# Patient Record
Sex: Male | Born: 1960 | Race: White | Hispanic: No | Marital: Married
Health system: Midwestern US, Community
[De-identification: ages and names within clinical notes are randomized; demographics above are authoritative.]

## PROBLEM LIST (undated history)

## (undated) DIAGNOSIS — R7303 Prediabetes: Secondary | ICD-10-CM

## (undated) DIAGNOSIS — E785 Hyperlipidemia, unspecified: Secondary | ICD-10-CM

## (undated) DIAGNOSIS — G4733 Obstructive sleep apnea (adult) (pediatric): Secondary | ICD-10-CM

## (undated) DIAGNOSIS — E291 Testicular hypofunction: Secondary | ICD-10-CM

## (undated) DIAGNOSIS — E559 Vitamin D deficiency, unspecified: Secondary | ICD-10-CM

## (undated) DIAGNOSIS — I10 Essential (primary) hypertension: Secondary | ICD-10-CM

## (undated) DIAGNOSIS — I493 Ventricular premature depolarization: Secondary | ICD-10-CM

## (undated) DIAGNOSIS — R0781 Pleurodynia: Secondary | ICD-10-CM

## (undated) HISTORY — DX: Vitamin D deficiency, unspecified: E55.9

## (undated) HISTORY — DX: Essential (primary) hypertension: I10

## (undated) HISTORY — DX: Prediabetes: R73.03

## (undated) HISTORY — DX: Testicular hypofunction: E29.1

## (undated) HISTORY — DX: Hyperlipidemia, unspecified: E78.5

## (undated) HISTORY — DX: Obstructive sleep apnea (adult) (pediatric): G47.33

---

## 1996-08-08 HISTORY — PX: ORIF PATELLA: SHX5033

## 1997-11-21 ENCOUNTER — Emergency Department (HOSPITAL_COMMUNITY): Admission: EM | Admit: 1997-11-21 | Discharge: 1997-11-21 | Payer: Self-pay | Admitting: Emergency Medicine

## 1999-10-12 ENCOUNTER — Encounter: Payer: Self-pay | Admitting: Emergency Medicine

## 1999-10-12 ENCOUNTER — Emergency Department (HOSPITAL_COMMUNITY): Admission: EM | Admit: 1999-10-12 | Discharge: 1999-10-12 | Payer: Self-pay | Admitting: Emergency Medicine

## 2000-09-24 ENCOUNTER — Emergency Department (HOSPITAL_COMMUNITY): Admission: EM | Admit: 2000-09-24 | Discharge: 2000-09-24 | Payer: Self-pay | Admitting: Emergency Medicine

## 2000-09-24 ENCOUNTER — Encounter (INDEPENDENT_AMBULATORY_CARE_PROVIDER_SITE_OTHER): Payer: Self-pay | Admitting: *Deleted

## 2000-09-24 ENCOUNTER — Encounter: Payer: Self-pay | Admitting: Otolaryngology

## 2000-11-14 ENCOUNTER — Emergency Department (HOSPITAL_COMMUNITY): Admission: EM | Admit: 2000-11-14 | Discharge: 2000-11-14 | Payer: Self-pay

## 2002-06-15 ENCOUNTER — Emergency Department (HOSPITAL_COMMUNITY): Admission: EM | Admit: 2002-06-15 | Discharge: 2002-06-15 | Payer: Self-pay | Admitting: Emergency Medicine

## 2002-06-15 ENCOUNTER — Encounter: Payer: Self-pay | Admitting: Emergency Medicine

## 2002-12-22 ENCOUNTER — Emergency Department (HOSPITAL_COMMUNITY): Admission: EM | Admit: 2002-12-22 | Discharge: 2002-12-22 | Payer: Self-pay

## 2003-11-08 ENCOUNTER — Emergency Department (HOSPITAL_COMMUNITY): Admission: AD | Admit: 2003-11-08 | Discharge: 2003-11-08 | Payer: Self-pay | Admitting: Internal Medicine

## 2004-03-05 ENCOUNTER — Emergency Department (HOSPITAL_COMMUNITY): Admission: EM | Admit: 2004-03-05 | Discharge: 2004-03-05 | Payer: Self-pay | Admitting: Family Medicine

## 2004-03-13 ENCOUNTER — Emergency Department (HOSPITAL_COMMUNITY): Admission: EM | Admit: 2004-03-13 | Discharge: 2004-03-13 | Payer: Self-pay

## 2004-03-14 ENCOUNTER — Emergency Department (HOSPITAL_COMMUNITY): Admission: EM | Admit: 2004-03-14 | Discharge: 2004-03-14 | Payer: Self-pay | Admitting: Emergency Medicine

## 2004-11-27 ENCOUNTER — Emergency Department (HOSPITAL_COMMUNITY): Admission: EM | Admit: 2004-11-27 | Discharge: 2004-11-27 | Payer: Self-pay | Admitting: Emergency Medicine

## 2005-01-04 ENCOUNTER — Encounter: Admission: RE | Admit: 2005-01-04 | Discharge: 2005-01-04 | Payer: Self-pay | Admitting: Neurology

## 2005-05-07 ENCOUNTER — Emergency Department (HOSPITAL_COMMUNITY): Admission: AD | Admit: 2005-05-07 | Discharge: 2005-05-07 | Payer: Self-pay | Admitting: Family Medicine

## 2005-12-23 ENCOUNTER — Inpatient Hospital Stay (HOSPITAL_COMMUNITY): Admission: EM | Admit: 2005-12-23 | Discharge: 2005-12-27 | Payer: Self-pay | Admitting: *Deleted

## 2005-12-25 ENCOUNTER — Encounter (INDEPENDENT_AMBULATORY_CARE_PROVIDER_SITE_OTHER): Payer: Self-pay | Admitting: Specialist

## 2005-12-27 ENCOUNTER — Ambulatory Visit: Payer: Self-pay | Admitting: Internal Medicine

## 2006-04-29 ENCOUNTER — Emergency Department (HOSPITAL_COMMUNITY): Admission: EM | Admit: 2006-04-29 | Discharge: 2006-04-29 | Payer: Self-pay | Admitting: Family Medicine

## 2006-06-09 ENCOUNTER — Ambulatory Visit (HOSPITAL_COMMUNITY): Admission: RE | Admit: 2006-06-09 | Discharge: 2006-06-09 | Payer: Self-pay | Admitting: Internal Medicine

## 2006-09-17 ENCOUNTER — Emergency Department (HOSPITAL_COMMUNITY): Admission: EM | Admit: 2006-09-17 | Discharge: 2006-09-17 | Payer: Self-pay | Admitting: Emergency Medicine

## 2007-04-15 ENCOUNTER — Emergency Department (HOSPITAL_COMMUNITY): Admission: EM | Admit: 2007-04-15 | Discharge: 2007-04-15 | Payer: Self-pay | Admitting: Family Medicine

## 2007-05-09 ENCOUNTER — Ambulatory Visit (HOSPITAL_COMMUNITY): Admission: RE | Admit: 2007-05-09 | Discharge: 2007-05-09 | Payer: Self-pay | Admitting: Internal Medicine

## 2007-11-15 ENCOUNTER — Emergency Department (HOSPITAL_COMMUNITY): Admission: EM | Admit: 2007-11-15 | Discharge: 2007-11-15 | Payer: Self-pay | Admitting: Emergency Medicine

## 2007-12-22 ENCOUNTER — Observation Stay (HOSPITAL_COMMUNITY): Admission: EM | Admit: 2007-12-22 | Discharge: 2007-12-23 | Payer: Self-pay | Admitting: Emergency Medicine

## 2008-11-28 ENCOUNTER — Emergency Department (HOSPITAL_COMMUNITY): Admission: EM | Admit: 2008-11-28 | Discharge: 2008-11-28 | Payer: Self-pay | Admitting: Emergency Medicine

## 2008-12-25 ENCOUNTER — Emergency Department (HOSPITAL_COMMUNITY): Admission: EM | Admit: 2008-12-25 | Discharge: 2008-12-25 | Payer: Self-pay | Admitting: Emergency Medicine

## 2009-07-30 ENCOUNTER — Encounter: Admission: RE | Admit: 2009-07-30 | Discharge: 2009-07-30 | Payer: Self-pay | Admitting: Internal Medicine

## 2010-01-24 ENCOUNTER — Emergency Department (HOSPITAL_COMMUNITY): Admission: EM | Admit: 2010-01-24 | Discharge: 2010-01-24 | Payer: Self-pay | Admitting: Emergency Medicine

## 2010-05-05 ENCOUNTER — Encounter: Admission: RE | Admit: 2010-05-05 | Discharge: 2010-05-05 | Payer: Self-pay | Admitting: Internal Medicine

## 2010-06-30 ENCOUNTER — Encounter (INDEPENDENT_AMBULATORY_CARE_PROVIDER_SITE_OTHER): Payer: Self-pay | Admitting: *Deleted

## 2010-10-25 LAB — POCT I-STAT, CHEM 8
Chloride: 107 mEq/L (ref 96–112)
Creatinine, Ser: 1.2 mg/dL (ref 0.4–1.5)
Glucose, Bld: 99 mg/dL (ref 70–99)
HCT: 45 % (ref 39.0–52.0)
Hemoglobin: 15.3 g/dL (ref 13.0–17.0)
Sodium: 141 mEq/L (ref 135–145)
TCO2: 25 mmol/L (ref 0–100)

## 2010-12-21 NOTE — H&P (Signed)
NAMEORMAN, [name]                ACCOUNT NO.:  [id number]   MEDICAL RECORD NO.:  [id number]          PATIENT TYPE:  INP   LOCATION:  4740                         FACILITY:  [facility]   PHYSICIAN:  Beckey Rutter, MD  DATE OF BIRTH:  [birth year]   DATE OF ADMISSION:  12/22/2007  DATE OF DISCHARGE:                              HISTORY & PHYSICAL   PRIMARY CARE PHYSICIAN:  Lovenia Kim, D.O.   CHIEF COMPLAINT:  Chest pain.   HISTORY OF PRESENT ILLNESS:  This is a 50 year old African American male  with past medical history significant for hypertension, acid reflux,  frequent headaches, and substance abuse, brought in today by ambulance  because of severe headache and chest pain.  The patient stated that the  headache has started since he woke up and associated with chest pain,  left-sided dull in nature.  The chest pain is constant since the start,  relieved after the patient received second dose of nitroglycerin by the  emergency medical personnel.  He denied any severe chest pain like this.  He denied radiation and he could not identify any aggravating factor.  The pain is 10/10 in its height.  The patient denied premature cardiac  disease in the family and he denied cough or diaphoresis.   REVIEW OF SYSTEMS:  He admit to have frequent headache with frequent  visitation to a neurologist and primary physician for this headache.  The headache is mostly after he wake up from sleep and he start talking.  But, the headache remains throughout the day.  He denied dyspnea, fever,  near-syncope, or palpitation.   PAST MEDICAL HISTORY:  Significant for,  1. Acid reflux disease.  2. Frequent headache.  3. Hypertension.  4. Substance abuse.  5. Tobacco abuse.   ALLERGIES:  NOT KNOWN TO HAVE ANY MEDICATION ALLERGIES.   SOCIAL HISTORY:  Current smoker and occasional drinker.  He denied drug  abuse, but admit to using cocaine last night and he stated that is only  night that he took  cocaine in his recent adult life.   FAMILY HISTORY:  No premature cardiac events.   MEDICATION:  1. Protonix 40 mg daily.  2. Flexeril p.r.n. for headache.   PHYSICAL EXAMINATION:  VITAL SIGNS:  Blood pressure initially done by  the EMS is 240/120, current blood pressure is 137/90, pulse 76,  respiratory rate is 16.  HEAD:  Atraumatic and normocephalic.  EYES: PERRL.  MOUTH:  Moist.  No ulcer.  He has crowded oropharynx.  NECK: Supple.  No JVD.  PERICARDIUM:  First and second sounds audible.  No added sounds.  LUNGS:  Bilateral fair air entry.  No adventitious sounds.  ABDOMEN:  Soft, nontender.  Bowel sounds present.  GENITOURINARY:  No CVA tenderness.  EXTREMITIES: No bilateral lower extremity edema.  NEUROLOGIC:  He is alert, oriented, moving all his extremities  spontaneously.   LABS AND X-RAY:  Drug screen is positive for cocaine.  Sodium is 139,  potassium 4.0, chloride 107, bicarb is 26, glucose 109, BUN is 19,  creatinine is 1.15, INR is 0.9, troponin  is less than 0.05, myoglobin is  63.7, CK-MB is 1.1, white blood count is 7.7, hemoglobin is 14.6,  hematocrit is 43.3, and platelet count is 243.  EKG is showing normal  sinus rhythm with ventricular rate 78, normal axis, normal intervals.  Head CT scan was showing no acute intracranial abnormalities.  Chest x-  ray showing low volumes film with chronic interstitial coarsening.  No  acute cardiopulmonary findings.   ASSESSMENT:  A 50 year old [place] American male presented with chest  tightness and hypertensive urgency after cocaine use.   PLAN:  1. The patient will be admitted to rule out acute coronary syndrome.  2. We will rule out myocardial event by serial cardiac enzymes and      serial EKGs.  3. We will consider cardiology stratification as per hospital course.  4. Substance abuse.  The patient was counseled and we will refer to      substance abuse counseling.  We will avoid beta blocker.  5. Hypertensive  urgency, resolved after sublingual nitro.  We will      continue to monitor.  6. Chronic headache.  The patient has crowded oropharynx and thick      neck where his headache starts mainly in the morning.  I will have      the patient on continuous pulse oximetry to monitor his saturation      during sleep.  The patient was advised to follow up with his      primary physician and primary neurologist for further testing.  For      DVT prophylaxis we will start Lovenox.  For GI management with the      symptoms of gastroesophageal reflux disease, we will start the      patient on Protonix intravenously today and we will continue on      p.o. Protonix afterwards.      Beckey Rutter, MD  Electronically Signed     EME/MEDQ  D:  12/22/2007  T:  12/23/2007  Job:  [id number]   cc:   Lovenia Kim, D.O.

## 2010-12-21 NOTE — Discharge Summary (Signed)
[name], [name]                ACCOUNT NO.:  [id number]   MEDICAL RECORD NO.:  [id number]          PATIENT TYPE:  INP   LOCATION:  4740                         FACILITY:  [facility]   PHYSICIAN:  Beckey Rutter, MD  DATE OF BIRTH:  10-17-1960   DATE OF ADMISSION:  12/22/2007  DATE OF DISCHARGE:  12/23/2007                               DISCHARGE SUMMARY   PRIMARY CARE PHYSICIAN:  Lovenia Kim, DO   CHIEF COMPLAINT ON PRESENTATION:  Chest pain.   HOSPITAL COURSE:  This is a 50 year old pleasant African-American male  presented with headache and chest pain that responded to nitroglycerin  sublingual.  The patient was admitted to rule out acute coronary  syndrome and he is being monitored for 24 hours with the telemetry with  no activity.  His cardiac enzymes were negative and no change in EKGs.  The chest tightness and the headache was felt secondary to the cocaine  abuse the night prior to his presentation.  The patient is stable for  discharge today.  He wanted to follow up with his primary physician for  further cardiology testing as needed.  Second problem was chronic  headache.  The patient was taking Flexeril p.r.n. for this purpose.  He  continued to have headache here which responded to Percocet.  Because  the history provided by the patient indicated that the headache started  after he wakes up, the intention was to have continuous pulse oximetry  at night.  Unfortunately, it was not done overnight, although the spot  O2 saturation/pulse oximetry was always more than 96%.  The patient had  a CT scan on the admission which is essentially negative.  Please refer  to the procedure below.   SUBSTANCE ABUSE:  The patient was counseled and some substance abuse  counseling material was provided.  The patient stated he used cocaine  only once in the recent adulthood life and I would tend to brief him.  At this point of time, I do not think he needs referral to rehab or any  other services.   DISCHARGE DIAGNOSES:  1. Chest tightness felt secondary to cocaine abuse .  2. Chronic headache.  3. Cocaine abuse.  4. Hypertension.  5. On and off tobacco abuse.   DISCHARGE MEDICATIONS:  1. Flexeril p.r.n.  2. Norvasc 5 mg p.o. daily.   Note:  The patient had high blood pressure on presentation and he was  kept on 5 mg of Norvasc overnight.  The blood pressure today is very  stable at 127/84 this morning.  The patient was advised to continue on  Norvasc and to follow with the primary physician for further management.   HOSPITAL PROCEDURES:  Chest x-ray done on the day of admission showing  low volume film with chronic interstitial coarsening.  No acute  cardiopulmonary findings.  Head CT was showing no evidence for acute  hemorrhage, hydrocephalus, mass lesion, or abnormal extra-axial fluid  collection.  No definite CT evidence for acute infarct.  Visualized  portions of the paranasal sinuses and mastoid air cells clear.  Lipid  profile  was showing cholesterol of 221, triglyceride of 250, HDL 45, LDL  is 126, TSH is 0.530.   DISCHARGE PLAN:  The patient is discharged to follow up with Dr.  Elisabeth Most as discussed with him.  He is aware and agreeable to discharge  plan.  Norvasc prescription was written.      Beckey Rutter, MD  Electronically Signed     EME/MEDQ  D:  12/23/2007  T:  12/23/2007  Job:  [id number] [id number]

## 2010-12-24 NOTE — Op Note (Signed)
[place]. [facility]  Patient:    [name], [name]                       MRN: [id number] Proc. Date: 09/24/00 Adm. Date:  [id number] Attending:  Lorre Nick                           Operative Report  PREOPERATIVE DIAGNOSIS:  Fish bone foreign body, left hypopharynx.  POSTOPERATIVE DIAGNOSIS:  Fish bone foreign body, left hypopharynx.  OPERATION:  Direct laryngoscopy and cervical esophagoscopy with removal of foreign body (fish bone).  SURGEON:  Carolan Shiver, M.D.  ANESTHESIA:  General endotracheal - Dr. Gelene Mink.  COMPLICATIONS:  None.  DISCHARGE STATUS:  Stable.  JUSTIFICATION FOR PROCEDURE:  The patient is a 50 year old black male who allegedly swallowed a fish bone on September 22, 2000.  He continued to have pain in his hypopharynx and presented to [facility] Room this morning where he was evaluated by Dr. ________.  The fish bone could not be found and I was contacted as I was on call for the emergency room.  Indirect laryngoscopy with a mirror and direct fiberoptic laryngoscopy performed to the patients left chamber did not reveal any foreign bodies in his hypopharynx. The patient pointed to his left cricoid area.  Soft tissue lateral of his neck and chest x-ray were also negative for foreign body.  Therefore, the patient was recommended for direct laryngoscopy and cervical esophagoscopy in the operating room under general endotracheal anesthesia.  Risks and complications of the procedure were explained to him.  Questions were invited and answered and informed consent signed.  JUSTIFICATION FOR OUTPATIENT SETTING:  Is the patients age and general endotracheal anesthesia.  JUSTIFICATION FOR OVERNIGHT STAY:  No applicable.  DESCRIPTION OF PROCEDURE:  After the patient was taken to the operating room, he was placed in the supine position, and an IV was begun.  General IV induction was performed by Dr. Gelene Mink and the patient was  orally intubated without difficulty.  Eye lids were taped shut and he was properly positioned and monitored.  Elbows and ankles were padded with foam rubber.  The head drape was applied.  The rubber tooth guard was placed and the patients oropharynx and proximal hypopharynx were evaluated with direct laryngoscopes.  Starting with a large wide laryngoscope and then eventually decreased to a size 2 anterior commissuroscope.  Examination of his base of tongue, lateral pharyngeal wall, piriform sinuses, and endolarynx were all negative on initial examination. Therefore, a direct rigid cervical esophagoscope was then inserted and again careful examination of the hypopharynx and endolarynx were performed, occluding the post cricoid area and the proximal cervical esophagus.  I was able to locate a 2.5 cm fish bone impinging in the left inferior base of tongue, lateral pharyngeal wall area.  The fish bone was grasped through the cervical esophagoscope with long cup forceps and directed into the esophagoscope.  The scope was then removed and the foreign body was then removed from the cervical esophagoscope.  A second look was performed with the cervical esophagoscope and no second foreign bodies were found.  The patient was then awakened, extubated, and transferred to his hospital bed. He appeared to tolerate both the general endotracheal anesthesia and the procedures well, and left the operating room in stable condition.  Total fluids 400 cc.  Estimated blood loss none.  The patient will be discharged today  as an outpatient.  He will be instructed to return to my office in one week for followup.  He is to follow a soft diet x 3 days, keep his head elevated, and avoid aspirin or aspirin products.  He is to call [phone] for any postoperative problems.  He will be given both verbal and written instructions. DD:  09/24/00 TD:  09/24/00 Job: [id number]

## 2010-12-24 NOTE — Consult Note (Signed)
[place]. [facility]  Patient:    [name], [name]                      MRN: [id number] Proc. Date: 09/24/00 Attending:  Carolan Shiver, M.D.                          Consultation Report  JUSTIFICATIONS FOR CONSULTATION AND INDICATIONS:  Naven Giambalvo. Coia is a 50 year old, black male, who was seen in [facility] Room this morning with a history of allegedly swallowing a fish bone inadvertently on September 22, 2000.  The fish bone had not passed.  He complained of chronic pain since the 15th.  He points to his midline of his neck just to the left cricoid ring area.  He has had no hemoptysis, change in voice, hoarseness, or history of previous foreign body ingestions.  He is a healthy male, no known allergies, takes no medications, has no loose teeth or dentures, and last drank a half a cup of coffee at 9 a.m. on September 24, 2000.  He has not had solid food for the past 24 hours.  PHYSICAL EXAMINATION:  GENERAL:  On physical examination he is a well-developed, well-nourished, black male, in no acute distress.  He pointed to his left cricoid area as the location of the foreign body.  HEENT:  Examination of his oral cavity showed no foreign bodies.  An anesthetized examination with a laryngeal mirrow, was unsuccessful.  His oropharynx was then sprayed with cetacaine x 2 and limited examination because of lack of cooperation was possible.  I could not locate any foreign body in his proximal base of tongue or proximal hypopharynx.  DESCRIPTION OF PROCEDURE:  The patients nasal chambers were sprayed with Neo-Synephrine spray, then his left nasal chamber was anesthetized with a 2% Xylocaine 1:100,000 epinephrine cotton pledget.  A direct fiberoptic laryngoscopy was then performed in the emergency room.  The direct ______ showed no foreign body could be located at the base of his tongue, lateral pharyngeal walls, vallecula, inferior hypopharynx, piriform  sinus, or endolarynx.  True vocal cords were mobile without any lesions or evidence of a foreign body.  IMPRESSION:  Possible fish bone foreign body hypopharynx or cervical esophagus.  RECOMMENDATIONS: 1. Chest x-ray, PA and lateral, soft tissue lateral neck films, ECG,    CBC with differential, BMET. 2. It is recommended that the patient be taken to the operating room for    direct laryngoscopy and a possible cervical esophagoscopy to attempt    to locate and remove a fish bone foreign body. 3. Risks and complications of the procedure were explained to the patient.    Questions were noted and answered in and informed consent to be    signed.  I also discussed with this Mr. Sorbo wife, who presented to the    emergency room after I had done my examination. DD:  09/24/00 TD:  09/24/00 Job: [id number]

## 2010-12-24 NOTE — Consult Note (Signed)
[name], [name]                ACCOUNT NO.:  [id number]   MEDICAL RECORD NO.:  [id number]          PATIENT TYPE:  INP   LOCATION:  4734                         FACILITY:  [facility]   PHYSICIAN:  Iva Boop, M.D. LHCDATE OF BIRTH:  08-18-1960   DATE OF CONSULTATION:  12/23/2005  DATE OF DISCHARGE:                                   CONSULTATION   REFERRING PHYSICIAN:  Mobolaji B. Corky Downs, M.D.   REASON FOR CONSULTATION:  GI bleeding.   ASSESSMENT:  Diarrhea x2 days and subsequent hematochezia.  His hemoglobin  is normal.  I am suspicious of an infectious diarrhea leading to bleeding.  Ischemic colitis is possible as well as he has a fair amount of cramps.   RECOMMENDATIONS:  1.  Follow up on stools studies.  2.  Review CT results not yet out.  3.  He may need a colonoscopy depending on the results of this.  4.  He apparently had some sort of a colon exam in the past, although he is      really unclear stating that he thought it was done for reflux.  We are      looking to see if there are records for that.  We do not have them at      [facility] and we are checking with [facility] gastroenterologist and here is      not an E-chart.   HISTORY OF PRESENT ILLNESS:  A 50 year old, African-American man that had  watery diarrhea started 2 days ago.  After about the fourth episode, he had  red blood and maybe some clots with that.  He has had no melena.  He has had  crampy abdominal pain, moderate to severe.  He has had small to moderate  amounts of blood.  There is no chronic history of this.  He has not had any  vomiting or nausea.  He had not had fever.  There is no foreign travel.  There is no recent obvious antibiotics, family history or contacts, that I  am aware of, that could lead to this problem.   PAST MEDICAL HISTORY:  1.  Hypertension.  2.  Headaches.  3.  Laryngoscopy.  4.  Removal of a fish bone in the pharynx in 2002.  5.  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   MEDICATIONS PRIOR TO ADMISSION:  Verapamil.   CURRENT MEDICATIONS:  Verapamil, Tylenol and Dilaudid.   SOCIAL HISTORY:  He works in [employer].  He is a smoker.  No significant alcohol reported.  No drug use.   FAMILY HISTORY:  No cancer.  No coronary artery disease.   REVIEW OF SYSTEMS:  Eye glasses are used.  All other systems are negative at  this time.   PHYSICAL EXAMINATION:  GENERAL:  A middle-age, black man in no acute  distress, well-developed, well-nourished.  VITAL SIGNS:  Temperature 98.6, blood pressure 155/74, pulse 59,  respirations 18.  HEENT:  Eyes anicteric.  Mouth free of lesions.  NECK:  Supple.  CHEST:  Clear.  HEART:  S1, S2, no murmurs,  rubs or gallops.  ABDOMEN:  Soft.  Left lower quadrant tenderness.  Mild to moderate.  No  organomegaly or mass.  Bowel sounds are present.  RECTAL:  He has external hemorrhoids with some fresh blood.  There is no  mass reported.  EXTREMITIES:  No clubbing, cyanosis or edema.  SKIN:  Warm and dry, no rash.  NEUROLOGIC:  He is alert and oriented and x3.  Grossly nonfocal.  LYMPHS:  No neck or supraclavicular nodes palpated.   LABORATORY DATA AND X-RAY FINDINGS:  Hemoglobin 15, hematocrit 42, white  count 17,000, platelets 251.  LFTs normal.  Coagulations normal.  BMET  normal including BUN 7 and creatinine 1.2.   CT scan report is pending.   I appreciate the opportunity to care for this patient.      Iva Boop, M.D. [facility]  Electronically Signed     CEG/MEDQ  D:  12/23/2005  T:  12/24/2005  Job:  [id number]   cc:   Lovenia Kim, D.O.  Fax: [phone]

## 2010-12-24 NOTE — Discharge Summary (Signed)
[name], [name]                ACCOUNT NO.:  [id number]   MEDICAL RECORD NO.:  [id number]          PATIENT TYPE:  INP   LOCATION:  4734                         FACILITY:  [facility]   PHYSICIAN:  Lonia Blood, M.D.       DATE OF BIRTH:  1961/04/28   DATE OF ADMISSION:  12/23/2005  DATE OF DISCHARGE:  12/27/2005                                 DISCHARGE SUMMARY   PRIMARY CARE PHYSICIAN:  Lovenia Kim, D.O.   DISCHARGE DIAGNOSIS:  1.  Acute colitis, rule out ulcerative colitis.  2.  Lower gastrointestinal bleed secondary to the colitis.  3.  Anemia.  4.  Hypertension.   DISCHARGE MEDICATIONS:  1.  Prilosec OTC 20 mg daily.  2.  Verapamil 120 mg daily.  3.  Asacol 400 mg 3 tablets twice a day   CONDITION ON DISCHARGE:  The patient will discharge in good condition.  At  the time of discharge he was instructed to follow up with Dr. Leone Payor from  [facility] gastroenterology for the final results of the biopsy of his colon,  and to followup also with Dr. Elisabeth Most his primary care physician.   PROCEDURES DURING THIS ADMISSION:  On Dec 24, 2005 [name] had colonoscopy  which showed acute colitis done by Dr. Lina Sar.   CONSULTATION DURING THIS ADMISSION:  On Dec 23, 2005 the patient was seen in  consultation by Dr. Leone Payor from [facility] gastroenterology   For complete admission history and physical refer to dictated H&P done by  Dr. Corky Downs.   HOSPITAL COURSE:  Problem lower GI bleed:  [name] was admitted to [facility] on Dec 23, 2005 with episodes of passing bright red blood per  rectum.  He had immediate evaluation with stool cultures as well as close  monitoring here in the hospital.  On Dec 23, 2005 the patient underwent a  computed tomography scan of abdomen and pelvis that showed normal  examination.  The patient was then taken to the endoscopy lab where he had a  colonoscopy done by Dr. Lina Sar which revealed an acute colitis.  The  patient had biopsies of  his colon obtained, and the results of the pathology  is still pending.  Dr. Juanda Chance felt that there was a good chance that this  patient may have ulcerative colitis, so Mr. Swindell was started empirically on  Asacol as well as Rowasa enemas.  The patient did respond immediately to the  treatments; and by discharge day on Dec 27 1005, he did not have any more  diarrhea.   The patient was discharged to follow up and instructed to follow up with Dr.  Leone Payor as well as Dr. Elisabeth Most for further decision about his dementia and  ulcerative colitis.  Of note, all the patient's stool cultures as well as  the Clostridium difficile toxin assays were negative.  [name] was without  any antibiotics for 48 hours prior to discharge and he remained afebrile and  stable.      Lonia Blood, M.D.  Electronically Signed     SL/MEDQ  D:  12/27/2005  T:  12/28/2005  Job:  [id number]   cc:   Lovenia Kim, D.O.  Fax: [phone]   Lina Sar, M.D. [facility]  [address]  [place]  [place] [address]   Iva Boop, M.D. [facility]  [address] [place], [place] [address]

## 2010-12-24 NOTE — H&P (Signed)
NAME:  [name], [name]                ACCOUNT NO.:  [id number]   MEDICAL RECORD NO.:  [id number]          PATIENT TYPE:  EMS   LOCATION:  MAJO                         FACILITY:  [facility]   PHYSICIAN:  Mobolaji B. Bakare, M.D.DATE OF BIRTH:  1961-04-28   DATE OF ADMISSION:  12/23/2005  DATE OF DISCHARGE:                                HISTORY & PHYSICAL   PRIMARY CARE PHYSICIAN:  Dr. Lovenia Kim.   CHIEF COMPLAINT:  Bleeding per rectum, bright red blood, since 2 days ago.   HISTORY OF THE PRESENTING COMPLAINT:  Mr. [name] is a pleasant 50 year old  African American male who was in his usual state of health until 2 days ago,  about 4 a.m.  He was woken up with abdominal cramp in the lower abdomen.  He  subsequently had 4 episodes of watery stool.  He felt nauseated, but no  vomiting.  There was no accompanying fever.  After the initial 4 sets of  watery stool, he developed frank blood per rectum.  It comes in clots.  The  cramps improve after passing blood per rectum.  Since 2 days ago, he has not  had any stool, but has been passing bright red blood per rectum every day  and he goes to the bathroom about 20-30 times and this bright red blood in  small quantities.  He denies fever.  He has never had this type of problem  before.  There has been no weight loss, no family history of inflammatory  bowel disease.  Of note is his temperature in the emergency room is 97.3.  He has a leukocytosis of 17,000.  He is awaiting CT scan of abdomen and  pelvis.   REVIEW OF SYSTEMS:  He denies weight loss, chills, fever, vomiting.  No  dysuria, urgency or change in frequency of micturition.  No cough, chest  pain or shortness of breath.  He has occasional headaches which he  attributes to sinuses and high blood pressure.  He uses Bayer Aspirin about  2 times a month to get relief from these headaches.  He last used aspirin 2  days ago, on the morning of onset of above symptoms.   PAST MEDICAL  HISTORY:  Hypertension.   MEDICATIONS:  Verapamil; the patient does not recall dosage.   ALLERGIES:  No known drug allergies.   FAMILY HISTORY:  He is married with children.  There is no family history of  cancer, diabetes or heart disease.   SOCIAL HISTORY:  He does not smoke cigarettes; he never smoked.  He drinks  alcohol occasionally.  He works as a Forensic psychologist.   PHYSICAL EXAMINATION:  INITIAL VITALS:  Temperature 97.3, blood pressure  155/103, now 149/107, heart rate 58-118, O2 SATs 100% on room air.  GENERAL:  The patient is comfortable, not in respiratory distress.  HEENT:  Normocephalic, atraumatic.  Head does not appear pale.  Mucous  membranes moist.  NECK:  No elevated JVD.  No thyromegaly.  LUNGS:  Clear clinically to auscultation.  CV:  S1 and S2 regular.  No murmur, no  gallop, no rub.  ABDOMEN:  Obese.  Soft.  No palpable tenderness.  No organomegaly.  Bowel  sounds present.  EXTREMITIES:  No pedal edema or calf tenderness.  Dorsalis pedis pulses  palpable bilaterally, 2+.  CNS:  No focal neurological deficits.  RECTAL:  There are multiple anal tags, no masses felt in the rectum.  Light-  colored blood stain on gloved finger.   INITIAL LABORATORY DATA:  White cell count 17.3, hemoglobin 15.2, hematocrit  42.8 and platelets 251,000, neutrophils 81%.  Sodium 140, potassium 4.0,  chloride 104, CO2 25, glucose 105, BUN 7, creatinine 1.2, bilirubin 0.7,  alkaline phosphatase 81, AST 18, ALT 15, total protein 7.1, albumin 4.0,  calcium 9.6.  PT 12.1, INR 0.9, PTT 27.  Fecal occult blood positive.   ASSESSMENT AND PLAN:  1.  Mr.  [name] is a 50 year old African American male presenting with      abdominal cramps preceding bright red blood per rectum.  He has      leukocytosis, but no fever.  He is hemodynamically stable and hematocrit      normal.  Gastrointestinal bleed most likely lower gastrointestinal bleed      which may be infectious or inflammatory  bowel disease.  CT scan of the      abdomen and pelvis is pending.  We will admit to telemetry floor, check      hemoglobin and hematocrit every 8 hours, intravenous fluids with normal      saline at 75 mL per hour, place on clear liquid diet, Protonix 40 mg      intravenously daily.  We will give Dilaudid 1 mg q.4 h. p.r.n. for pain,      obtain stool for culture, Clostridium difficile and fecal leukocytes.      Gastroenterology will be consulted for colonoscopy.  2.  Hypertension.  Blood pressure is currently elevated.  We will resume      verapamil CD 120 mg daily until family able to bring medication dosage.      Mobolaji B. Corky Downs, M.D.  Electronically Signed     MBB/MEDQ  D:  12/23/2005  T:  12/23/2005  Job:  [id number]   cc:   Lovenia Kim, D.O.  Fax: [phone]

## 2011-01-31 NOTE — Letter (Signed)
Summary: Colonoscopy Letter  [facility]  [address] [place], [place] [address]   Phone: [phone]  Fax: [phone]      June 30, 2010 MRN: [id number]   [facility] [address] [place], [place]  [address]   Dear Mr. Pember,   According to your medical record, it is time for you to schedule a Colonoscopy. The American Cancer Society recommends this procedure as a method to detect early colon cancer. Patients with a family history of colon cancer, or a personal history of colon polyps or inflammatory bowel disease are at increased risk.  This letter has been generated based on the recommendations made at the time of your procedure. If you feel that in your particular situation this may no longer apply, please contact our office.  Please call our office at [phone] to schedule this appointment or to update your records at your earliest convenience.  Thank you for cooperating with [place] to provide you with the very best care possible.   Sincerely,    Iva Boop, M.D.  [facility] [phone]

## 2011-05-04 LAB — COMPREHENSIVE METABOLIC PANEL
ALT: 19
AST: 20
Albumin: 3.8
Alkaline Phosphatase: 76
Alkaline Phosphatase: 84
BUN: 21
CO2: 26
Chloride: 107
Chloride: 108
Creatinine, Ser: 1.15
GFR calc Af Amer: >60
GFR calc non Af Amer: >60
Glucose, Bld: 109 — ABNORMAL HIGH
Potassium: 3.7
Potassium: 4
Sodium: 142
Total Bilirubin: 0.5
Total Protein: 6.1
Total Protein: 6.7

## 2011-05-04 LAB — POCT CARDIAC MARKERS: Troponin i, poc: <0.05

## 2011-05-04 LAB — DIFFERENTIAL
Basophils Absolute: 0.1
Eosinophils Absolute: 0.2
Eosinophils Relative: 2
Monocytes Absolute: 0.6
Neutro Abs: 4.4

## 2011-05-04 LAB — RAPID URINE DRUG SCREEN, HOSP PERFORMED
Amphetamines: NOT DETECTED
Cocaine: POSITIVE — AB
Tetrahydrocannabinol: NOT DETECTED

## 2011-05-04 LAB — CBC
HCT: 43.3
MCHC: 33.8
RBC: 4.66

## 2011-05-04 LAB — CK TOTAL AND CKMB (NOT AT ARMC)
Relative Index: 1
Total CK: 230

## 2011-05-04 LAB — CARDIAC PANEL(CRET KIN+CKTOT+MB+TROPI)
CK, MB: 1.4
Total CK: 155
Total CK: 180
Troponin I: 0.02

## 2011-05-04 LAB — PHOSPHORUS: Phosphorus: 4.7 — ABNORMAL HIGH

## 2011-05-04 LAB — LIPID PANEL
Cholesterol: 221 — ABNORMAL HIGH
HDL: 45
LDL Cholesterol: 126 — ABNORMAL HIGH

## 2011-05-04 LAB — TSH: TSH: 0.53

## 2011-05-04 LAB — PROTIME-INR: INR: 0.9

## 2011-06-04 ENCOUNTER — Emergency Department (HOSPITAL_COMMUNITY)
Admission: EM | Admit: 2011-06-04 | Discharge: 2011-06-04 | Disposition: A | Discharge status: Home / Self Care | Payer: BC Managed Care – PPO | Attending: Emergency Medicine | Admitting: Emergency Medicine

## 2011-06-04 DIAGNOSIS — K219 Gastro-esophageal reflux disease without esophagitis: Secondary | ICD-10-CM | POA: Insufficient documentation

## 2011-06-04 DIAGNOSIS — I10 Essential (primary) hypertension: Secondary | ICD-10-CM | POA: Insufficient documentation

## 2011-06-04 DIAGNOSIS — Z9889 Other specified postprocedural states: Secondary | ICD-10-CM | POA: Insufficient documentation

## 2011-06-04 DIAGNOSIS — J3489 Other specified disorders of nose and nasal sinuses: Secondary | ICD-10-CM | POA: Insufficient documentation

## 2011-06-04 DIAGNOSIS — R51 Headache: Secondary | ICD-10-CM | POA: Insufficient documentation

## 2011-06-04 DIAGNOSIS — Z79899 Other long term (current) drug therapy: Secondary | ICD-10-CM | POA: Insufficient documentation

## 2011-09-08 NOTE — Progress Notes (Addendum)
CARDIOLOGY CONSULT NOTE       Reason for consultation:  tachycardia    Referring physician:  No admitting provider for patient encounter.     Primary care physician: No primary provider on file.  Alper sarihan DO    Dear  [name],  Thanks for the consult.    History of present illness:Carl Castro is a 51 y.o.year old who  has a past medical history of Hyperlipidemia. and presents with had a chief complaint of Palpitations and an additional complaint of Tachycardia.  he counted 161 HR at one time.    Past medical history:    has a past medical history of Hyperlipidemia.  Past surgical history:   has no past surgical history on file.  Social History:     Family history:  family history includes Heart Disease in his father.    Allergies not on file    No prescriptions on file.    Current Outpatient Prescriptions   Medication Sig Dispense Refill   . aspirin 81 MG tablet Take 81 mg by mouth daily.       . rosuvastatin (CRESTOR) 5 MG tablet Take 5 mg by mouth daily.         No current facility-administered medications for this visit.     Review of Systems: Review of Systems:    Constitutional: No Fever or Weight Loss    Eyes: No Decreased Vision   ENT: No Headaches, Hearing Loss or Vertigo   Cardiovascular: rapid HR   Respiratory: No cough or wheezing     Gastrointestinal: No abdominal pain, appetite loss, blood in stools, constipation, diarrhea or heartburn   Genitourinary: No dysuria, trouble voiding, or hematuria   Musculoskeletal:  No gait disturbance, weakness or joint complaints   Integumentary: No rash or pruritis   Neurological: No TIA or stroke symptoms   Psychiatric: No anxiety or depression   Endocrine: No malaise, fatigue or temperature intolerance   Hematologic/Lymphatic: No bleeding problems, blood clots or swollen lymph nodes  Allergic/Immunologic: No nasal congestion or hives      Physical Examination:    Filed Vitals:    09/08/11 1547   BP: 130/80   Pulse: 68        General Appearance:  good    HEENT:  Pupils are equal and rect to ligh.    NECK: No JVP or carotid bruits    Cardiovascular:  Auscultation: Normal S1 and S2,      Respiratory:  Breath sounds are Normal    Extremities:  No Edema clubbing or cyanosis    SKIN: Warm and well perfused, no pallor or cyanosis    Vascular exam:  Aebdominal Aorta: Normal ,Femoral Arteries: 2+ and equal, Pedal Pulses: 2+ and equal     Abdomen:  No masses or tenderness. No organomegaly noted.    Neurological/Psychiatric:  Oriented to time, place, and person and Non-anxious  No focal neurological deficit noted.    Lab Review   No results found for this basename: WBC, HGB, HCT, PLT,  in the last 72 hours   No results found for this basename: NA, K, CL, CO2, PHOS, BUN, CREATININE, CA,  in the last 72 hours  No results found for this basename: AST, ALT, ALB, BILIDIR, BILITOT, ALKPHOS,  in the last 72 hours  No results found for this basename: TROPONINI,  in the last 72 hours  No results found for this basename: BNP     No results found for this basename: INR,  PROTIME  EKG:    Chest Xray:    ECHO:    Assessment:      Patient Active Problem List   Diagnosis Code   . Hyperlipidemia 272.4   . Tachycardia 785.0       Recommendations:   Echo  Holter for 48  Stress       [name], MD, 09/08/2011 4:12 PM

## 2011-09-15 MED ADMIN — thallous chloride (201 THALLIUM) injection 4 milli Curie: INTRAVENOUS | @ 14:00:00 | NDC 17156029916

## 2011-09-15 MED FILL — THALLOUS CHLORIDE TL 201 1 MCI/ML IV SOLN: 1 MCI/ML | INTRAVENOUS | Qty: 6.6

## 2011-09-26 ENCOUNTER — Telehealth

## 2011-09-26 MED ORDER — METOPROLOL TARTRATE 25 MG PO TABS
25 MG | ORAL_TABLET | Freq: Two times a day (BID) | ORAL | Status: DC
Start: 2011-09-26 — End: 2012-01-11

## 2011-09-26 NOTE — Telephone Encounter (Signed)
Message copied by Terence Lux on Mon Sep 26, 2011  2:00 PM  ------       Message from: Deveron Furlong [facility]       Created: Sun Sep 25, 2011  7:42 PM         Add lopressor 25 bid       Pt has freq pvcs       Fu as scheduled  ------

## 2011-09-26 NOTE — Telephone Encounter (Signed)
Pt returned call after this nurse left msg.Informed pt that per Dr.Akhter wants to start pt on Lopressor 25mg  bid d/t frequent PVCs;Educated pt concerning PVCs & new med.Pt voiced understanding.Sent Rx electronically via Epic to [facility] on [address] per pt's request.

## 2011-10-06 NOTE — Progress Notes (Signed)
[name]  MD  [facility]                                  Dipl. CBNC, NBE, CBCCT, RPVI                                     OFFICE  FOLLOWUP NOTE    Subjective:  Caidence is a 51 y.o.year old who  has a past medical history of Hyperlipidemia; Tachycardia; Palpitations; History of complete ECG; Frequent PVCs; H/O cardiovascular stress test; Family history of early CAD; Atypical chest pain; and H/O echocardiogram. and presents with had a chief complaint of Results.  Chief Complaint   Patient presents with   ??? Results     pt here for the results of holter, echo, and NM. Pt states plaps have calmed down alot since he started metoprolol if having any at all they are only lasting seconds. Pt denies CP, SOB, dizziness, pedal edema.     Filed Vitals:    10/06/11 1632   BP: 128/74   Pulse: 60       Current Outpatient Prescriptions   Medication Sig Dispense Refill   ??? metoprolol (LOPRESSOR) 25 MG tablet Take 1 tablet by mouth 2 times daily.  60 tablet  6   ??? aspirin 81 MG tablet Take 81 mg by mouth daily.       ??? rosuvastatin (CRESTOR) 5 MG tablet Take 5 mg by mouth daily.         No current facility-administered medications for this visit.     Allergies: Review of patient's allergies indicates no known allergies.  Past Medical History   Diagnosis Date   ??? Hyperlipidemia    ??? Tachycardia    ??? Palpitations    ??? History of complete ECG 09/08/2011   ??? Frequent PVCs    ??? H/O cardiovascular stress test 09/15/2011     09/15/2011-Normal study. No ischemia. LVSF normal. EF 63%.    ??? Family history of early CAD    ??? Atypical chest pain    ??? H/O echocardiogram 09/15/2011     09/15/2011- LVSF normal. EF 55%.Impaired LV relaxation. Mild TR.-report in epic     No past surgical history on file.  Family History   Problem Relation Age of Onset   ??? Heart Disease Father      CMP A-fib     History   Substance Use Topics   ??? Smoking status: Never Smoker    ??? Smokeless tobacco: Not on file   ??? Alcohol Use: Not on file       Review of systems:  HEENT: Neg  Card:Neg   GI;Neg  GU: Neg  Neuro: Neg  Psych: Neg  Derm: Neg  MS; Neg  All: Documented  Constitutional: Neg    Objective:  BP 128/74   Pulse 60   Ht 6\' 3"  (1.905 m)   Wt 250 lb (113.399 kg)   BMI 31.25 kg/m2  Wt Readings from Last 3 Encounters:   10/06/11 250 lb (113.399 kg)   09/08/11 250 lb (113.399 kg)     Body mass index is 31.25 kg/(m^2).  GENERAL - Alert, oriented, pleasant, in no apparent distress.    HEENT - Unremarkable.  Neck - Supple.  No jugular venous distention noted. No carotid bruits.   Cardiovascular - Normal S1  and S2 without obvious murmur or gallop.    Extremities - No cyanosis, clubbing, or significant edema.    Pulmonary - No respiratory distress.  No wheezes or rales.    Abdomen - No masses, tenderness, or organomegaly.  Musculoskeletal - No significant edema.   Neurologic - Cranial nerves II through XII are grossly intact.  There were no gross focal neurologic abnormalities.    No results found for this basename: CKTOTAL,  CKMB,  CKMBINDEX,  TROPONINI     BNP:    No results found for this basename: BNP     PT/INR:    No components found with this basename: PTPATIENT,  PTINR     No results found for this basename: LABA1C     No results found for this basename: chol,  trig,  hdl,  ldlcalc,  ldldirect     No results found for this basename: ALT,  AST     TSH:    No results found for this basename: TSH       Impression:    @DIAGXICD9 @   Patient Active Problem List   Diagnosis Code   ??? Hyperlipidemia 272.4   ??? Tachycardia 785.0   ??? V tach 427.1     @PROBNGICD9 @  Problem List Items Addressed This Visit    Hyperlipidemia    Tachycardia    V tach - Primary        Plan:  Palpitatios are better  Repeat holter in 4 weeks  FU in 3 mos

## 2011-10-17 NOTE — Telephone Encounter (Signed)
Results of nm and echo called to patient

## 2011-11-10 NOTE — Telephone Encounter (Signed)
Per DR.Mohammed,phoned pt & informed him to be @hospital  tomorrow @ 6am for STAT upon arrival.Pt voiced understanding.Faxed cover sheet over to central scheduling & cath.lab @[facility]  concerning above.ALso faxed over signed consent per DR.Mohammed to cath lab for pt to sign upon arrival to hospital tomorrow.

## 2011-11-10 NOTE — H&P (Signed)
HISTORY & PHYSICAL  Reason for hospitalization: Cath to R/O significant CAD because of multiple NSVT  ( on going ) runs.    History of present illness:  Carl Castro is a 51 y.o.year old who has a past medical history of Hyperlipidemia; Tachycardia; Palpitations; History of complete ECG; Frequent PVCs; H/O cardiovascular stress test; Family history of early CAD; Atypical chest pain; and H/O echocardiogram. and is brought in for cath.  denies CP, SOB, dizziness, pedal edema.    Filed Vitals:     10/06/11 1632    BP:  128/74    Pulse:  60      Current Outpatient Prescriptions    Medication  Sig  Dispense  Refill    .  metoprolol (LOPRESSOR) 25 MG tablet  Take 1 tablet by mouth 2 times daily.  60 tablet  6    .  aspirin 81 MG tablet  Take 81 mg by mouth daily.      .  rosuvastatin (CRESTOR) 5 MG tablet  Take 5 mg by mouth daily.        No current facility-administered medications for this visit.    Allergies: Review of patient's allergies indicates no known allergies.   Past Medical History    Diagnosis  Date    .  Hyperlipidemia     .  Tachycardia     .  Palpitations     .  History of complete ECG  09/08/2011    .  Frequent PVCs     .  H/O cardiovascular stress test  09/15/2011      09/15/2011-Normal study. No ischemia. LVSF normal. EF 63%.    .  Family history of early CAD     .  Atypical chest pain     .  H/O echocardiogram  09/15/2011      09/15/2011- LVSF normal. EF 55%.Impaired LV relaxation. Mild TR.-report in epic    No past surgical history on file.   Family History    Problem  Relation  Age of Onset    .  Heart Disease  Father       CMP A-fib      History    Substance Use Topics    .  Smoking status:  Never Smoker    .  Smokeless tobacco:  Not on file    .  Alcohol Use:  Not on file    Review of systems:   HEENT: Neg   Card:Neg   GI;Neg   GU: Neg   Neuro: Neg   Psych: Neg   Derm: Neg   MS; Neg   All: Documented   Constitutional: Neg   Objective:   BP 128/74  Pulse 60  Ht  6\' 3"  (1.905 m)  Wt 250 lb (113.399 kg)  BMI 31.25 kg/m2   Wt Readings from Last 3 Encounters:    10/06/11  250 lb (113.399 kg)    09/08/11  250 lb (113.399 kg)    Body mass index is 31.25 kg/(m^2).   GENERAL - Alert, oriented, pleasant, in no apparent distress.   HEENT - Unremarkable.   Neck - Supple. No jugular venous distention noted. No carotid bruits.   Cardiovascular - Normal S1 and S2 without obvious murmur or gallop.   Extremities - No cyanosis, clubbing, or significant edema.   Pulmonary - No respiratory distress. No wheezes or rales.   Abdomen - No masses, tenderness, or organomegaly.   Musculoskeletal - No significant edema.   Neurologic - Cranial  nerves II through XII are grossly intact. There were no gross focal neurologic abnormalities.   Labs : pending    Impression:      Patient Active Problem List    Diagnosis  Code    .  Hyperlipidemia  272.4    .  Tachycardia  785.0    .  V tach  427.1      Problem List Items Addressed This Visit     Hyperlipidemia     Tachycardia     V tach - Primary       Plan:  Left hear cath with coronary angiography & left ventriculgraphy.  Informed consent obtained.

## 2011-11-10 NOTE — Telephone Encounter (Signed)
LM on pt's VM @ home # concerning that Dr.Mohammed wants to do LHC poss on pt tomorrow;also called pt's wife on her cell #,informing her of the same.She stated would have pt call this nurse back.

## 2011-11-10 NOTE — Telephone Encounter (Signed)
Pt returned call & informed him about his holter monitor,along w/DR.Mohammed wanting to do LHC poss.d/t Dr.Akhter is out of town.Pt agreed.Pt educated on LHC poss.,scheduled for tomorrow @ 9am,w/arrival @ 7am,@[facility] ;risks explained;& will sign consent upon arrival to hosp.in am.Also labs & CXR will be done STAT upon arrival as well;NPO after midnight;may take morning meds.w/only sips of water tomorrow am;& pt voiced understanding.Also informed him if he or his wife have questions or concerns this evening,to call office back before 5pm.Dr.Mohammed to place orders in Epic system.

## 2011-11-11 LAB — CBC WITH AUTO DIFFERENTIAL
Basophils %: 0.6 % (ref 0–1)
Basophils Absolute: 0 10*3/uL (ref 0.0–0.1)
Eosinophils %: 2 % (ref 0–4)
Eosinophils Absolute: 0.1 10*3/uL (ref 0.0–0.6)
Hematocrit: 46 % (ref 41–51)
Hemoglobin: 15.3 GM/DL (ref 14.0–18.0)
Lymphocytes %: 45.2 % — ABNORMAL HIGH (ref 22–40)
Lymphocytes Absolute: 3 10*3/uL (ref 0.6–4.3)
MCH: 31 PG (ref 28–34)
MCHC: 33.2 % (ref 32.0–36.0)
MCV: 93.3 FL (ref 82–101)
MPV: 9.2 FL (ref 7.5–11.1)
Monocytes %: 7.6 % (ref 5–9)
Monocytes Absolute: 0.5 10*3/uL (ref 0.2–1.1)
Platelets: 221 10*3/uL (ref 140–440)
RBC: 4.93 10*6/uL (ref 4.45–6.0)
RDW: 13.6 % (ref 11.7–14.9)
Segs Absolute: 2.9 10*3/uL (ref 1.5–7.0)
Segs Relative: 44.6 % — ABNORMAL LOW (ref 49–69)
WBC: 6.6 10*3/uL (ref 4.5–11.0)

## 2011-11-11 LAB — BASIC METABOLIC PANEL
BUN: 14 mg/dL (ref 6–23)
CO2: 26 mMol/L (ref 20–32)
Calcium: 9.2 mg/dL (ref 8.3–10.6)
Chloride: 112 mMol/L — ABNORMAL HIGH (ref 99–109)
Creatinine: 1 mg/dL (ref 0.9–1.3)
GFR African American: >60 mL/min/{1.73_m2}
GFR Non-African American: >60 mL/min/{1.73_m2}
Glucose: 114 MG/DL (ref 70–140)
Potassium: 4.2 MMOL/L (ref 3.5–5.1)
Sodium: 144 mmol/L (ref 136–145)

## 2011-11-11 LAB — PROTIME-INR
INR: 0.99 INDEX
Protime: 10.8 SECONDS (ref 10–14.3)

## 2011-11-11 MED ADMIN — 0.9 % sodium chloride infusion: INTRAVENOUS | @ 11:00:00 | NDC 00338004904

## 2011-11-11 MED ADMIN — diphenhydrAMINE (BENADRYL) tablet 50 mg: ORAL | @ 11:00:00 | NDC 00904530661

## 2011-11-11 MED ADMIN — diazepam (VALIUM) tablet 5 mg: ORAL | @ 11:00:00 | NDC 63739007310

## 2011-11-11 MED FILL — DIAZEPAM 5 MG PO TABS: 5 MG | ORAL | Qty: 1

## 2011-11-11 MED FILL — NORMAL SALINE FLUSH 0.9 % IV SOLN: 0.9 % | INTRAVENOUS | Qty: 10

## 2011-11-11 MED FILL — LIDOCAINE HCL 2 % IJ SOLN: 2 % | INTRAMUSCULAR | Qty: 20

## 2011-11-11 MED FILL — MIDAZOLAM HCL 2 MG/2ML IJ SOLN: 2 MG/ML | INTRAMUSCULAR | Qty: 2

## 2011-11-11 MED FILL — HEPARIN (PORCINE) IN NACL 2-0.9 UNIT/ML-% IJ SOLN: INTRAMUSCULAR | Qty: 1500

## 2011-11-11 MED FILL — DIPHENHYDRAMINE HCL 25 MG PO TABS: 25 MG | ORAL | Qty: 2

## 2011-11-11 MED FILL — OPTIRAY 350 74 % IV SOLN: 74 % | INTRAVENOUS | Qty: 50

## 2011-11-11 MED FILL — OPTIRAY 350 74 % IV SOLN: 74 % | INTRAVENOUS | Qty: 100

## 2011-11-11 NOTE — Progress Notes (Signed)
Received into cath lab holding to prepare for procedure. Procedure explained, verbalizes understanding.  Pre-procedure assessment done.

## 2011-11-11 NOTE — Discharge Instructions (Addendum)
Cardiac catheterization  Home Instuctions  Name:Carl Castro Age  DOB:[birth year]  Your instructions:    Shower only for the first 5 days, NO TUB BATHS.      Wash procedure site with mild soap, rinse and pat dry.  Do not rub over the procedure site for two (2) days.  Remove dressing and replace with a band-aid in 2 days.    Site observations-Observe the area for bleeding or hematoma (lump under the skin caused by bleeding.)      A.  Painless bruising is normal.  B.  If a firmness/swelling seems to be increasing or there is bright red bleeding from site       (hold pressure over procedure site) and  CALL 911 IMMEDIATELY.    What to do after you leave the hospital:    Recommended activity: no lifting, Driving, or Strenuous exercise for 3 days.  DO NOT lift more than 10lbs.    Follow-up with physician in 7-10 days.    Take your medication as ordered.      If you have any questions, you may call [phone] or your physicians office    Information obtained by:  By signing below, I understand that if any problems occur once I leave the hospital I am to contact my cardiologist or family physician.  I understand and acknowledge receipt of the instructions indicated above.

## 2011-11-11 NOTE — Progress Notes (Signed)
Received from cath lab/IR.  Hooked to all monitoring equipment, alarms on.  Assessment completed and post procedure vital signs initiated. Offered and given refreshment and food tray ordered.

## 2011-11-11 NOTE — Progress Notes (Signed)
Ambulated to bathroom and back to room.  R groin free of bleeding or hematoma.  Discharge instructions given to patient and his wife.  Verbalizes understanding.

## 2011-11-11 NOTE — Progress Notes (Deleted)
Transferred to 3109 at 1140 per bed on monitor.

## 2011-11-11 NOTE — Progress Notes (Signed)
Discharged to home with wife via main entrance.

## 2011-11-11 NOTE — Op Note (Signed)
[name] (51 y.o., male)  09-13-1960      11/11/2011      PROCEDURE:  Left heart catheterization, selective coronary arteriography,  left ventricular angiography.     PREPROCEDURE DIAGNOSIS:  NSVT      POSTOPERATIVE DIAGNOSIS:  Patent coronary arteries and preserved left  ventricular function.  Right heart cath was not done.     DESCRIPTION OF PROCEDURE:  The patient had the procedure described and  informed consent is obtained.  Prepared, draped and monitored in the routine  fashion.  Two percent lidocaine without epinephrine was used for local  infiltration anesthesia.  Time out procedure performed per protocol.  The  right femoral artery was punctured and cannulated with 4-French introducer  sheath.  A 4-French, 4-cm left Judkins and 4 F AR Mod catheters were used for  selective coronary arteriography.  Multiple views were obtained in various  projections.  Left ventricular angiography was performed using angled pigtail left  ventricular angiography catheter. Image obtained in 30 degree RAO view.  He  tolerated the procedure well.  At the end of the procedure, femoral arterial  sheath was discontinued and hemostasis achieved with manual compression.   Total fluoroscopic time was 1.5 minutes.  Total amount of contrast used was  70 mL.  No immediate complications were noted.  Estimated blood loss was <10 mL.     FINDINGS:  Hemodynamics:  Aortic pressure 100/61, mean of 77, LV pressure  100, EDP of 9.      ANGIOGRAPHIC FINDINGS: Right dominant system.    1.  Left main:  Left main is normal  2.  Left anterior descending is normal        Diagonal branchnormal  3.  Left Circumflex artery is normal        Obtuse marginal branch is normal       Ramus intermedius branch is normal  4.  Right coronary artery is normal        Postetrolateral branch is normal        PDA branch is normal   5.  Left ventricle is normal in size and shape with normal contractility.   Ejection fraction is 55 %.     No immediate  complications.      COMMENTS AND DISPOSITION   Medical management + EP evaluation ? RVOT v-tach.   The patient is counseledfor continued risk modification for  prevention.    Findings are reviewedwith the patient and discussed with the family.    Follow up in office in one week.                                                                      Rejeana Brock, MD, [facility],.

## 2011-11-15 NOTE — Progress Notes (Signed)
Carl Brooks  MD  [facility]                                  Dipl. CBNC, NBE, CBCCT, RPVI                                     OFFICE  FOLLOWUP NOTE    Subjective:  Carl Castro is a 52 y.o.year old who  has a past medical history of Hyperlipidemia; Tachycardia; Palpitations; History of complete ECG; Frequent PVCs; H/O cardiovascular stress test; Family history of early CAD; Atypical chest pain; H/O echocardiogram; and S/P cardiac cath. and presents with had a chief complaint of Follow Up After Procedure.  Chief Complaint   Patient presents with   . Follow Up After Procedure     Pt denies any large knots at groin site, however he states he didnt notice any bruising until last night and states today it's bigger and deeper shade, but it's not painful. Pt states he has noticed that he occ gets alittle dizzy while standing doesnt happen all the time only on occ and dizziness only lasts seconds.     Filed Vitals:    11/15/11 1447   BP: 134/72   Pulse: 64       Current Outpatient Prescriptions   Medication Sig Dispense Refill   . metronidazole (METROGEL) 0.75 % gel Apply  topically daily. Apply topically 2 times daily.       . metoprolol (LOPRESSOR) 25 MG tablet Take 1 tablet by mouth 2 times daily.  60 tablet  6   . aspirin 81 MG tablet Take 81 mg by mouth daily.       . rosuvastatin (CRESTOR) 5 MG tablet Take 5 mg by mouth daily.         No current facility-administered medications for this visit.     Allergies: Review of patient's allergies indicates no known allergies.  Past Medical History   Diagnosis Date   . Hyperlipidemia    . Tachycardia    . Palpitations    . History of complete ECG 09/08/2011   . Frequent PVCs    . H/O cardiovascular stress test 09/15/2011     09/15/2011-Normal study. No ischemia. LVSF normal. EF 63%.    . Family history of early CAD    . Atypical chest pain    . H/O echocardiogram 09/15/2011     09/15/2011- LVSF normal. EF 55%.Impaired LV relaxation. Mild TR.-report in epic    . S/P cardiac cath 11/11/2011     normal coronaries. reffer for EP study     No past surgical history on file.  Family History   Problem Relation Age of Onset   . Heart Disease Father      CMP A-fib     History   Substance Use Topics   . Smoking status: Former Games developer   . Smokeless tobacco: Not on file   . Alcohol Use: Not on file      Review of systems:  HEENT: Neg  Card:Neg   GI;Neg  GU: Neg  Neuro: Neg  Psych: Neg  Derm: Neg  MS; Neg  All: Documented  Constitutional: Neg    Objective:  BP 134/72  Pulse 64  Ht 6\' 3"  (1.905 m)  Wt 244 lb 8 oz (110.904 kg)  BMI 30.56 kg/m2  Wt Readings  from Last 3 Encounters:   11/15/11 244 lb 8 oz (110.904 kg)   11/11/11 245 lb (111.131 kg)   10/06/11 250 lb (113.399 kg)     Body mass index is 30.56 kg/(m^2).  GENERAL - Alert, oriented, pleasant, in no apparent distress.    HEENT - Unremarkable.  Neck - Supple.  No jugular venous distention noted. No carotid bruits.   Cardiovascular - Normal S1 and S2 without obvious murmur or gallop.    Extremities - No cyanosis, clubbing, or significant edema.    Pulmonary - No respiratory distress.  No wheezes or rales.    Abdomen - No masses, tenderness, or organomegaly.  Musculoskeletal - No significant edema.   Neurologic - Cranial nerves II through XII are grossly intact.  There were no gross focal neurologic abnormalities.    No results found for this basename: CKTOTAL,  CKMB,  CKMBINDEX,  TROPONINI     BNP:    No results found for this basename: BNP     PT/INR:    No components found with this basename: PTPATIENT,  PTINR     No results found for this basename: LABA1C     No results found for this basename: chol,  trig,  hdl,  ldlcalc,  ldldirect     No results found for this basename: ALT,  AST     TSH:    No results found for this basename: TSH     Carl Castro (51 y.o., male)   16-Jun-1961   11/11/2011   PROCEDURE: Left heart catheterization, selective coronary arteriography,   left ventricular angiography.   PREPROCEDURE DIAGNOSIS:  NSVT   POSTOPERATIVE DIAGNOSIS: Patent coronary arteries and preserved left   ventricular function. Right heart cath was not done.   DESCRIPTION OF PROCEDURE: The patient had the procedure described and   informed consent is obtained. Prepared, draped and monitored in the routine   fashion. Two percent lidocaine without epinephrine was used for local   infiltration anesthesia. Time out procedure performed per protocol. The   right femoral artery was punctured and cannulated with 4-French introducer   sheath. A 4-French, 4-cm left Judkins and 4 F AR Mod catheters were used for   selective coronary arteriography. Multiple views were obtained in various   projections. Left ventricular angiography was performed using angled pigtail left   ventricular angiography catheter. Image obtained in 30 degree RAO view. He   tolerated the procedure well. At the end of the procedure, femoral arterial   sheath was discontinued and hemostasis achieved with manual compression.   Total fluoroscopic time was 1.5 minutes. Total amount of contrast used was   70 mL. No immediate complications were noted. Estimated blood loss was <10 mL.   FINDINGS: Hemodynamics: Aortic pressure 100/61, mean of 77, LV pressure   100, EDP of 9.   ANGIOGRAPHIC FINDINGS: Right dominant system.   1. Left main: Left main is normal   2. Left anterior descending is normal   Diagonal branchnormal   3. Left Circumflex artery is normal   Obtuse marginal branch is normal   Ramus intermedius branch is normal   4. Right coronary artery is normal   Postetrolateral branch is normal   PDA branch is normal   5. Left ventricle is normal in size and shape with normal contractility.   Ejection fraction is 55 %.   No immediate complications.   COMMENTS AND DISPOSITION   Medical management + EP evaluation ? RVOT v-tach.   The  patient is counseledfor continued risk modification for prevention.   Findings are reviewedwith the patient and discussed with the family.   Follow up in  office in one week.   Rejeana Brock, MD, [facility],.         Impression:    @DIAGXICD9 @   Patient Active Problem List   Diagnosis Code   . Hyperlipidemia 272.4   . Tachycardia 785.0   . V tach 427.1     @PROBNGICD9 @  Problem List Items Addressed This Visit    Hyperlipidemia    Tachycardia    V tach - Primary        Plan:  Refer to Dr Raechel Ache  Rt groin [place]

## 2011-11-16 NOTE — Telephone Encounter (Signed)
Paper work sent to Dr. Raechel Ache 11-15-11 they will contact patient for appointment.

## 2011-11-16 NOTE — Telephone Encounter (Signed)
PATIENT TO CALL TODAY IF DIDN'T HEAR FROM [place] REGAURDING APPT WITH DR HACKETT GAVE PATIENT NUMBER TO CHECK ON APPT ALSO

## 2011-11-28 NOTE — Addendum Note (Signed)
Addended by: Chiquita Loth on: 11/28/2011 02:40 PM     Modules accepted: Orders

## 2011-12-13 NOTE — Telephone Encounter (Signed)
Taken care of

## 2012-01-11 NOTE — Progress Notes (Signed)
Carl Brooks  MD  [facility]                                  Dipl. CBNC, NBE, CBCCT, RPVI                                     OFFICE  FOLLOWUP NOTE    Subjective:  Carl Castro is a 51 y.o.year old who  has a past medical history of Hyperlipidemia; Tachycardia; Palpitations; History of complete ECG; Frequent PVCs; H/O cardiovascular stress test; Family history of early CAD; Atypical chest pain; H/O echocardiogram; S/P cardiac cath; H/O 24 hour EKG monitoring; and S/P radiofrequency ablation operation for arrhythmia. and presents with had a chief complaint of Palpitations.  Chief Complaint   Patient presents with   ??? Palpitations     OV 3 month check, no cardiac sx     Filed Vitals:    01/11/12 1639   BP: 134/80   Pulse: 80       Current Outpatient Prescriptions   Medication Sig Dispense Refill   ??? metronidazole (METROGEL) 0.75 % gel Apply  topically daily. Apply topically 2 times daily.       ??? aspirin 81 MG tablet Take 81 mg by mouth daily.       ??? rosuvastatin (CRESTOR) 5 MG tablet Take 5 mg by mouth daily.         No current facility-administered medications for this visit.     Allergies: Review of patient's allergies indicates no known allergies.  Past Medical History   Diagnosis Date   ??? Hyperlipidemia    ??? Tachycardia    ??? Palpitations    ??? History of complete ECG 09/08/2011   ??? Frequent PVCs    ??? H/O cardiovascular stress test 09/15/2011     09/15/2011-Normal study. No ischemia. LVSF normal. EF 63%.    ??? Family history of early CAD    ??? Atypical chest pain    ??? H/O echocardiogram 09/15/2011     09/15/2011- LVSF normal. EF 55%.Impaired LV relaxation. Mild TR.-report in epic   ??? S/P cardiac cath 11/11/2011     normal coronaries. reffer for EP study   ??? H/O 24 hour EKG monitoring 11/03/11     48 hour- the predominant rhythm is sinus rhythm with one 10-beat run of nonsustained ventricular tachycardia, the patient is already being referred for EP study to Dr. Dianah Field in [place]   ??? S/P  radiofrequency ablation operation for arrhythmia      No past surgical history on file.  Family History   Problem Relation Age of Onset   ??? Heart Disease Father      CMP A-fib     History   Substance Use Topics   ??? Smoking status: Former Smoker   ??? Smokeless tobacco: Former Neurosurgeon   ??? Alcohol Use: Yes      Review of systems:  HEENT: Neg  Card:Neg   GI;Neg  GU: Neg  Neuro: Neg  Psych: Neg  Derm: Neg  MS; Neg  All: Documented  Constitutional: Neg    Objective:  BP 134/80   Pulse 80   Ht 6\' 3"  (1.905 m)   Wt 243 lb (110.224 kg)   BMI 30.37 kg/m2  Wt Readings from Last 3 Encounters:   01/11/12 243 lb (110.224 kg)  11/15/11 244 lb 8 oz (110.904 kg)   11/11/11 245 lb (111.131 kg)     Body mass index is 30.37 kg/(m^2).  GENERAL - Alert, oriented, pleasant, in no apparent distress.    HEENT - Unremarkable.  Neck - Supple.  No jugular venous distention noted. No carotid bruits.   Cardiovascular - Normal S1 and S2 without obvious murmur or gallop.    Extremities - No cyanosis, clubbing, or significant edema.    Pulmonary - No respiratory distress.  No wheezes or rales.    Abdomen - No masses, tenderness, or organomegaly.  Musculoskeletal - No significant edema.   Neurologic - Cranial nerves II through XII are grossly intact.  There were no gross focal neurologic abnormalities.    No results found for this basename: CKTOTAL,  CKMB,  CKMBINDEX,  TROPONINI     BNP:    No results found for this basename: BNP     PT/INR:    No components found with this basename: PTPATIENT,  PTINR     No results found for this basename: LABA1C     No results found for this basename: chol,  trig,  hdl,  ldlcalc,  ldldirect     No results found for this basename: ALT,  AST     TSH:    No results found for this basename: TSH       Impression:    @DIAGXICD9 @   Patient Active Problem List   Diagnosis Code   ??? Hyperlipidemia 272.4   ??? Tachycardia 785.0   ??? V tach 427.1   ??? Swelling 782.3   ??? H/O 24 hour EKG monitoring V15.89   ??? Palpitations 785.1   ???  Frequent PVCs 427.69     @PROBNGICD9 @    Plan:  FU in 6 mos

## 2012-04-10 ENCOUNTER — Encounter: Payer: Self-pay | Admitting: Internal Medicine

## 2012-08-29 NOTE — Progress Notes (Signed)
Carl Brooks  MD  [facility]                                  Dipl. CBNC, NBE, CBCCT, RPVI                                     OFFICE  FOLLOWUP NOTE    Subjective:    CC: palpitation    HPI:    Carl Castro is a 52 y.o.year old who  has a past medical history of Hyperlipidemia; Tachycardia; Palpitations; History of complete ECG; Frequent PVCs; H/O cardiovascular stress test; Family history of early CAD; Atypical chest pain; H/O echocardiogram; S/P cardiac cath; H/O 24 hour EKG monitoring; and S/P radiofrequency ablation operation for arrhythmia. and presents with had no chief complaint listed for this encounter.  No chief complaint on file.    There were no vitals filed for this visit.    Current Outpatient Prescriptions   Medication Sig Dispense Refill   ??? metronidazole (METROGEL) 0.75 % gel Apply  topically daily. Apply topically 2 times daily.       ??? aspirin 81 MG tablet Take 81 mg by mouth daily.       ??? rosuvastatin (CRESTOR) 5 MG tablet Take 5 mg by mouth daily.         No current facility-administered medications for this visit.     Allergies: Review of patient's allergies indicates no known allergies.  Past Medical History   Diagnosis Date   ??? Hyperlipidemia    ??? Tachycardia    ??? Palpitations    ??? History of complete ECG 09/08/2011   ??? Frequent PVCs    ??? H/O cardiovascular stress test 09/15/2011     09/15/2011-Normal study. No ischemia. LVSF normal. EF 63%.    ??? Family history of early CAD    ??? Atypical chest pain    ??? H/O echocardiogram 09/15/2011     09/15/2011- LVSF normal. EF 55%.Impaired LV relaxation. Mild TR.-report in epic   ??? S/P cardiac cath 11/11/2011     normal coronaries. reffer for EP study   ??? H/O 24 hour EKG monitoring 11/03/11     48 hour- the predominant rhythm is sinus rhythm with one 10-beat run of nonsustained ventricular tachycardia, the patient is already being referred for EP study to Dr. Dianah Field in [place]   ??? S/P radiofrequency ablation operation for  arrhythmia      No past surgical history on file.  Family History   Problem Relation Age of Onset   ??? Heart Disease Father      CMP A-fib     History   Substance Use Topics   ??? Smoking status: Former Smoker   ??? Smokeless tobacco: Former Neurosurgeon   ??? Alcohol Use: Yes        Review of Systems:   ?? Constitutional: No Fever or Weight Loss   ?? Eyes: No Decreased Vision  ?? ENT: No Headaches, Hearing Loss or Vertigo  ?? Cardiovascular: No chest pain, dyspnea on exertion, palpitations or loss of consciousness  ?? Respiratory: No cough or wheezing    ?? Gastrointestinal: No abdominal pain, appetite loss, blood in stools, constipation, diarrhea or heartburn  ?? Genitourinary: No dysuria, trouble voiding, or hematuria  ?? Musculoskeletal:  No gait disturbance, weakness or joint complaints  ?? Integumentary: No rash  or pruritis  ?? Neurological: No TIA or stroke symptoms  ?? Psychiatric: No anxiety or depression  ?? Endocrine: No malaise, fatigue or temperature intolerance  ?? Hematologic/Lymphatic: No bleeding problems, blood clots or swollen lymph nodes  ?? Allergic/Immunologic: No nasal congestion or hives    Objective:    There were no vitals taken for this visit.  Wt Readings from Last 3 Encounters:   01/11/12 243 lb (110.224 kg)   11/15/11 244 lb 8 oz (110.904 kg)   11/11/11 245 lb (111.131 kg)     There is no weight on file to calculate BMI.  GENERAL - Alert, oriented, pleasant, in no apparent distress.    Head unremarkable  Eyes unremarkable  ENT - Unremarkable.  Neck - Supple.  No jugular venous distention noted. No carotid bruits.   Cardiovascular - Normal S1 and S2 without obvious murmur or gallop.    Extremities - No cyanosis, clubbing, or significant edema.    Pulmonary - No respiratory distress.  No wheezes or rales.    Pulses: Bilateral radial and pedal pulses normal  Abdomen - No masses, tenderness, or organomegaly.  Musculoskeletal - normal   Neurologic -   There are  no gross focal neurologic abnormalities.  Skin-  normal  Affect; normal    DATA:    No results found for this basename: CKTOTAL, CKMB, CKMBINDEX, TROPONINI     BNP:    No results found for this basename: BNP     PT/INR:    No components found with this basename: PTPATIENT, PTINR     No results found for this basename: LABA1C     No results found for this basename: chol, trig, hdl, ldlcalc, ldldirect     No results found for this basename: ALT, AST     TSH:    No results found for this basename: TSH       EKG  Labs, tests, notes reviewed    QUALITY MEASURES:  CAD:  No   CHOL LOWERING:  No- if No Why  ANTIPLATELET:  No - if No why    Impression:      @DIAGXICD9 @   Patient Active Problem List   Diagnosis Code   ??? Hyperlipidemia 272.4   ??? Tachycardia 785.0   ??? V tach 427.1   ??? Swelling 782.3   ??? H/O 24 hour EKG monitoring V15.89   ??? Palpitations 785.1   ??? Frequent PVCs 427.69     @PROBNGICD9 @  Problem List Items Addressed This Visit    Hyperlipidemia    Tachycardia - Primary    V tach    Palpitations    Frequent PVCs          PLAN:  - ARRYTHMIAS  stable    -  LIPID MANAGEMENT:  1. Diet advised,Meds reviewed,Lipid profile reviewed with Pt  2. Changes:No  3. Test ordered; No                    No results found for this basename: chol, HDL, ldlcalcu, LDLcholesterol, LDLdirect     - V tach stable  - LHC was OK    FU in one yr

## 2013-06-13 ENCOUNTER — Telehealth: Payer: Self-pay | Admitting: Internal Medicine

## 2013-06-13 DIAGNOSIS — I10 Essential (primary) hypertension: Secondary | ICD-10-CM

## 2013-06-13 MED ORDER — BISOPROLOL-HYDROCHLOROTHIAZIDE 5-6.25 MG PO TABS: 1.0000 | ORAL_TABLET | Freq: Every day | ORAL | Status: DC

## 2013-06-13 NOTE — Telephone Encounter (Signed)
Fax refill for bisoprolol-hctz 5-6.25mg  tab 5-6.25mg  qty 90

## 2013-06-17 ENCOUNTER — Ambulatory Visit: Payer: BC Managed Care – PPO | Admitting: Emergency Medicine

## 2013-06-17 ENCOUNTER — Encounter: Payer: Self-pay | Admitting: Emergency Medicine

## 2013-06-17 VITALS — BP 122/84 | HR 62 | Temp 98.2°F | Resp 18 | Ht 71.0 in | Wt 242.0 lb

## 2013-06-17 DIAGNOSIS — E782 Mixed hyperlipidemia: Secondary | ICD-10-CM

## 2013-06-17 DIAGNOSIS — M25572 Pain in left ankle and joints of left foot: Secondary | ICD-10-CM

## 2013-06-17 DIAGNOSIS — E291 Testicular hypofunction: Secondary | ICD-10-CM

## 2013-06-17 DIAGNOSIS — I10 Essential (primary) hypertension: Secondary | ICD-10-CM | POA: Insufficient documentation

## 2013-06-17 LAB — CBC WITH DIFFERENTIAL/PLATELET
Basophils Absolute: 0 10*3/uL (ref 0.0–0.1)
Lymphocytes Relative: 29 % (ref 12–46)
Neutro Abs: 7.2 10*3/uL (ref 1.7–7.7)
Platelets: 286 10*3/uL (ref 150–400)
RBC: 5.03 MIL/uL (ref 4.22–5.81)
RDW: 13.4 % (ref 11.5–15.5)
WBC: 12.2 10*3/uL — ABNORMAL HIGH (ref 4.0–10.5)

## 2013-06-17 LAB — URIC ACID: Uric Acid, Serum: 10.7 mg/dL — ABNORMAL HIGH (ref 4.0–7.8)

## 2013-06-17 MED ORDER — HYDROCODONE-ACETAMINOPHEN 5-325 MG PO TABS: 1.0000 | ORAL_TABLET | Freq: Four times a day (QID) | ORAL | Status: DC | PRN

## 2013-06-17 MED ORDER — TESTOSTERONE CYPIONATE 200 MG/ML IM SOLN
400.0000 mg | Freq: Once | INTRAMUSCULAR | Status: AC
  Administered 2013-06-17: 400 mg via INTRAMUSCULAR

## 2013-06-17 NOTE — Patient Instructions (Signed)
Ankle Pain Ankle pain is a common symptom. The bones, cartilage, tendons, and muscles of the ankle joint perform a lot of work each day. The ankle joint holds your body weight and allows you to move around. Ankle pain can occur on either side or back of 1 or both ankles. Ankle pain may be sharp and burning or dull and aching. There may be tenderness, stiffness, redness, or warmth around the ankle. The pain occurs more often when a person walks or puts pressure on the ankle. CAUSES  There are many reasons ankle pain can develop. It is important to work with your caregiver to identify the cause since many conditions can impact the bones, cartilage, muscles, and tendons. Causes for ankle pain include:  Injury, including a break (fracture), sprain, or strain often due to a fall, sports, or a high-impact activity.  Swelling (inflammation) of a tendon (tendonitis).  Achilles tendon rupture.  Ankle instability after repeated sprains and strains.  Poor foot alignment.  Pressure on a nerve (tarsal tunnel syndrome).  Arthritis in the ankle or the lining of the ankle.  Crystal formation in the ankle (gout or pseudogout). DIAGNOSIS  A diagnosis is based on your medical history, your symptoms, results of your physical exam, and results of diagnostic tests. Diagnostic tests may include X-ray exams or a computerized magnetic scan (magnetic resonance imaging, MRI). TREATMENT  Treatment will depend on the cause of your ankle pain and may include:  Keeping pressure off the ankle and limiting activities.  Using crutches or other walking support (a cane or brace).  Using rest, ice, compression, and elevation.  Participating in physical therapy or home exercises.  Wearing shoe inserts or special shoes.  Losing weight.  Taking medications to reduce pain or swelling or receiving an injection.  Undergoing surgery. HOME CARE INSTRUCTIONS   Only take over-the-counter or prescription medicines for  pain, discomfort, or fever as directed by your caregiver.  Put ice on the injured area.  Put ice in a plastic bag.  Place a towel between your skin and the bag.  Leave the ice on for 15-20 minutes at a time, 03-04 times a day.  Keep your leg raised (elevated) when possible to lessen swelling.  Avoid activities that cause ankle pain.  Follow specific exercises as directed by your caregiver.  Record how often you have ankle pain, the location of the pain, and what it feels like. This information may be helpful to you and your caregiver.  Ask your caregiver about returning to work or sports and whether you should drive.  Follow up with your caregiver for further examination, therapy, or testing as directed. SEEK MEDICAL CARE IF:   Pain or swelling continues or worsens beyond 1 week.  You have an oral temperature above 102 F (38.9 C).  You are feeling unwell or have chills.  You are having an increasingly difficult time with walking.  You have loss of sensation or other new symptoms.  You have questions or concerns. MAKE SURE YOU:   Understand these instructions.  Will watch your condition.  Will get help right away if you are not doing well or get worse. Document Released: 01/12/2010 Document Revised: 10/17/2011 Document Reviewed: 01/12/2010 [facility] Patient Information 2014 [place], [place]. Marland Kitchen Gout Gout is when your joints become red, sore, and swell (inflammed). This is caused by the buildup of uric acid crystals in the joints. Uric acid is a chemical that is normally in the blood. If the level of uric acid  gets too high in the blood, these crystals form in your joints and tissues. Over time, these crystals can form into masses near the joints and tissues. These masses can destroy bone and cause the bone to look misshapen (deformed). HOME CARE   Do not take aspirin for pain.  Only take medicine as told by your doctor.  Rest the joint as much as you can. When in bed,  keep sheets and blankets off painful areas.  Keep the sore joints raised (elevated).  Put warm or cold packs on painful joints. Use of warm or cold packs depends on which works best for you.  Use crutches if the painful joint is in your leg.  Drink enough fluids to keep your pee (urine) clear or pale yellow. Limit alcohol, sugary drinks, and drinks with fructose in them.  Follow your diet instructions. Pay careful attention to how much protein you eat. Include fruits, vegetables, whole grains, and fat-free or low-fat milk products in your daily diet. Talk to your doctor or dietician about the use of coffee, vitamin C, and cherries. These may help lower uric acid levels.  Keep a healthy body weight. GET HELP RIGHT AWAY IF:   You have watery poop (diarrhea), throw up (vomit), or have any side effects from medicines.  You do not feel better in 24 hours, or you are getting worse.  Your joint becomes suddenly more tender, and you have chills or a fever. MAKE SURE YOU:   Understand these instructions.  Will watch your condition.  Will get help right away if you are not doing well or get worse. Document Released: 05/03/2008 Document Revised: 11/19/2012 Document Reviewed: 11/02/2009 [facility] Patient Information 2014 [place], [place].

## 2013-06-18 ENCOUNTER — Telehealth: Payer: Self-pay | Admitting: *Deleted

## 2013-06-18 ENCOUNTER — Encounter: Payer: Self-pay | Admitting: Emergency Medicine

## 2013-06-18 MED ORDER — ALLOPURINOL 300 MG PO TABS: 300.0000 mg | ORAL_TABLET | Freq: Every day | ORAL | Status: DC

## 2013-06-18 MED ORDER — PREDNISONE (PAK) 10 MG PO TABS: ORAL_TABLET | ORAL | Status: DC

## 2013-06-18 MED ORDER — BISOPROLOL FUMARATE 5 MG PO TABS: 5.0000 mg | ORAL_TABLET | Freq: Every day | ORAL | Status: DC

## 2013-06-18 NOTE — Telephone Encounter (Signed)
Advised pt of lab results.  Rx's sent to pharmacy by Loree Fee, PA-C. Pt requesting out of work note for today with increased pain symptoms.  Note will be up front for pt to pick up from front desk.

## 2013-06-18 NOTE — Progress Notes (Signed)
  Subjective:    Patient ID: [name], male    DOB: [birth year], 52 y.o.   MRN: [id number]  HPI Comments: 51 yo male presents with left foot pain and swelling on/ off x 6 months without HX of trauma. Denies gout HX. He admits changing shoes, rest, and elevation helps for a short period. Standing in one position makes pain worse. No OTC remedies.  Foot Pain Associated symptoms include arthralgias and joint swelling.    Ziac 5 Pravastatin 40 Testosterone 200mg /ml 1/2 cc q 3week D 5000Iu Magnesium 250    Review of Systems  Musculoskeletal: Positive for arthralgias and joint swelling.  All other systems reviewed and are negative.     BP 122/84  Pulse 62  Temp(Src) 98.2 F (36.8 C) (Temporal)  Resp 18  Ht 5\' 11"  (1.803 m)  Wt 242 lb (109.77 kg)  BMI 33.77 kg/m2     Objective:   Physical Exam  Nursing note and vitals reviewed. Constitutional: He is oriented to person, place, and time. He appears well-developed and well-nourished.  HENT:  Head: Normocephalic.  Neck: Normal range of motion.  Cardiovascular: Normal rate, regular rhythm, normal heart sounds and intact distal pulses.   Pulmonary/Chest: Effort normal and breath sounds normal.  Musculoskeletal: Normal range of motion.  Left foot with pain/ tenderness anterior/ proximal to ankle with mild erythema, no obvious edema.   Neurological: He is alert and oriented to person, place, and time. He has normal reflexes.  Skin: Skin is warm and dry.  See MS  Psychiatric: Judgment normal.          Assessment & Plan:  Left foot pain vs Gout vs infection, check labs and xray. R.I.C.E Norco 5/325 PRN Pain.

## 2013-06-18 NOTE — Telephone Encounter (Signed)
Message copied by Nicholaus Corolla A on Tue Jun 18, 2013 10:59 AM ------      Message from: [place], [place] R      Created: Tue Jun 18, 2013  5:35 AM       + GOUT, I will escribe RX for Allopurinol 300 mg AD, Prednisone 10 mg DP AD, and will need to D/C [facility] and call in Bisoprolol 5 mg only due to HCTZ may contribute to GOUT. He can still take Norco AD. May get worse for a day or 2 before improvement. Hold xray for now if no change with pain will need to get. Need recheck 4 weeks OV. ------

## 2013-06-18 NOTE — Addendum Note (Signed)
Addended by: Loree Fee R on: 06/18/2013 05:38 AM   Modules accepted: Orders

## 2013-06-20 ENCOUNTER — Ambulatory Visit (HOSPITAL_COMMUNITY)
Admission: RE | Admit: 2013-06-20 | Discharge: 2013-06-20 | Disposition: A | Discharge status: Home / Self Care | Payer: BC Managed Care – PPO | Source: Ambulatory Visit | Attending: Emergency Medicine | Admitting: Emergency Medicine

## 2013-06-20 ENCOUNTER — Telehealth: Payer: Self-pay | Admitting: Emergency Medicine

## 2013-06-20 DIAGNOSIS — M25572 Pain in left ankle and joints of left foot: Secondary | ICD-10-CM

## 2013-06-20 DIAGNOSIS — M79609 Pain in unspecified limb: Secondary | ICD-10-CM | POA: Insufficient documentation

## 2013-06-20 DIAGNOSIS — M25579 Pain in unspecified ankle and joints of unspecified foot: Secondary | ICD-10-CM | POA: Insufficient documentation

## 2013-06-20 NOTE — Telephone Encounter (Signed)
Pt taking steriod for gout for two days still in a lot of pain.  Please advise.

## 2013-06-20 NOTE — Telephone Encounter (Signed)
Advise patient to continue RX as directed and [facility]. He can have 2 more days off work and that is all.

## 2013-06-20 NOTE — Telephone Encounter (Signed)
PHONED PT TO CONTINUE RX AS DIRECTED AND HE IS GOING TO [facility] FOR XRAY TODAY. PT REQUESTING OUT OF WORK NOTE FROM Tuesday 06-18-13 THUR 06-21-13.  PLEASE ADVISE ON NOTE. Rosamaria Lints

## 2013-06-24 ENCOUNTER — Telehealth: Payer: Self-pay | Admitting: *Deleted

## 2013-06-24 MED ORDER — COLCHICINE 0.6 MG PO TABS: 0.6000 mg | ORAL_TABLET | Freq: Every day | ORAL | Status: DC

## 2013-06-24 NOTE — Telephone Encounter (Signed)
Patient advised that the new rx was sent in

## 2013-07-09 ENCOUNTER — Other Ambulatory Visit: Payer: Self-pay | Admitting: Physician Assistant

## 2013-07-09 MED ORDER — HYDROCHLOROTHIAZIDE 25 MG PO TABS: 25.0000 mg | ORAL_TABLET | Freq: Every day | ORAL | Status: DC

## 2013-07-15 ENCOUNTER — Other Ambulatory Visit: Payer: Self-pay | Admitting: Internal Medicine

## 2013-07-24 ENCOUNTER — Encounter: Payer: Self-pay | Admitting: Internal Medicine

## 2013-07-25 ENCOUNTER — Ambulatory Visit: Payer: Self-pay | Admitting: Emergency Medicine

## 2013-08-05 ENCOUNTER — Ambulatory Visit: Payer: Self-pay | Admitting: Emergency Medicine

## 2013-08-05 ENCOUNTER — Other Ambulatory Visit: Payer: Self-pay | Admitting: Internal Medicine

## 2013-08-05 DIAGNOSIS — M25572 Pain in left ankle and joints of left foot: Secondary | ICD-10-CM

## 2013-08-05 MED ORDER — ALLOPURINOL 300 MG PO TABS: 300.0000 mg | ORAL_TABLET | Freq: Every day | ORAL | Status: DC

## 2013-08-15 ENCOUNTER — Ambulatory Visit: Payer: Self-pay | Admitting: Emergency Medicine

## 2013-09-09 ENCOUNTER — Other Ambulatory Visit: Payer: Self-pay | Admitting: Internal Medicine

## 2013-09-09 ENCOUNTER — Other Ambulatory Visit: Payer: Self-pay | Admitting: Physician Assistant

## 2013-09-11 NOTE — Progress Notes (Signed)
Carl Brooks  MD  [facility]                                  Dipl. [facility], [facility], [facility], [facility]                                     OFFICE  FOLLOWUP NOTE    Subjective:        CC: 6 month FU    See below for additional complains    HPI:    Carl Castro is a 53 y.o.year old who  has a past medical history of Hyperlipidemia; Tachycardia; Palpitations; History of complete ECG; Frequent PVCs; H/O cardiovascular stress test; Family history of early CAD; Atypical chest pain; H/O echocardiogram; S/P cardiac cath; H/O 24 hour EKG monitoring; and S/P radiofrequency ablation operation for arrhythmia. and presents with had concerns including Palpitations; and Hyperlipidemia.  Chief Complaint   Patient presents with   ??? Palpitations     Pt. is here for a one year offive visit. Pt. denies cp, sob, dizziness or edema. Pt. has palpitations but not often and not very long maybe 30 secs.   ??? Hyperlipidemia     Pt. takes crestor and is due for labs.     Filed Vitals:    09/11/13 1637   BP: 118/80   Pulse: 68       Current Outpatient Prescriptions   Medication Sig Dispense Refill   ??? metronidazole (METROGEL) 0.75 % gel Apply  topically daily. Apply topically 2 times daily.       ??? aspirin 81 MG tablet Take 81 mg by mouth daily.       ??? rosuvastatin (CRESTOR) 5 MG tablet Take 5 mg by mouth daily.         No current facility-administered medications for this visit.     Allergies: Review of patient's allergies indicates no known allergies.  Past Medical History   Diagnosis Date   ??? Hyperlipidemia    ??? Tachycardia    ??? Palpitations    ??? History of complete ECG 09/08/2011   ??? Frequent PVCs    ??? H/O cardiovascular stress test 09/15/2011     09/15/2011-Normal study. No ischemia. LVSF normal. EF 63%.    ??? Family history of early CAD    ??? Atypical chest pain    ??? H/O echocardiogram 09/15/2011     09/15/2011- LVSF normal. EF 55%.Impaired LV relaxation. Mild TR.-report in epic   ??? S/P cardiac cath 11/11/2011     normal coronaries.  reffer for EP study   ??? H/O 24 hour EKG monitoring 11/03/11     48 hour- the predominant rhythm is sinus rhythm with one 10-beat run of nonsustained ventricular tachycardia, the patient is already being referred for EP study to Dr. Dianah Field in [place]   ??? S/P radiofrequency ablation operation for arrhythmia      History reviewed. No pertinent past surgical history.  Family History   Problem Relation Age of Onset   ??? Heart Disease Father      CMP A-fib     History   Substance Use Topics   ??? Smoking status: Former Smoker   ??? Smokeless tobacco: Former Neurosurgeon   ??? Alcohol Use: Yes        Review of Systems:   ?? Constitutional: No Fever or Weight Loss   ??  Eyes: No Decreased Vision  ?? ENT: No Headaches, Hearing Loss or Vertigo  ?? Cardiovascular: No chest pain, dyspnea on exertion, palpitations or loss of consciousness  ?? Respiratory: No cough or wheezing    ?? Gastrointestinal: No abdominal pain, appetite loss, blood in stools, constipation, diarrhea or heartburn  ?? Genitourinary: No dysuria, trouble voiding, or hematuria  ?? Musculoskeletal:  none  ?? Integumentary: No rash or pruritis  ?? Neurological: No TIA or stroke symptoms  ?? Psychiatric: No anxiety or depression  ?? Endocrine: No malaise, fatigue or temperature intolerance  ?? Hematologic/Lymphatic: No bleeding problems, blood clots or swollen lymph nodes  ?? Allergic/Immunologic: No nasal congestion or hives    Objective:      Physical Exam:  BP 118/80    Pulse 68    Ht  (1.905 m)    Wt 251 lb (113.853 kg)    BMI 31.37 kg/m2     Wt Readings from Last 3 Encounters:   09/11/13 251 lb (113.853 kg)   08/29/12 251 lb (113.853 kg)   01/11/12 243 lb (110.224 kg)     Body mass index is 31.37 kg/(m^2).  GENERAL - Alert, oriented, pleasant, in no apparent distress.    Head unremarkable  Eyes unremarkable  ENT - Unremarkable.  Neck - Supple.  No jugular venous distention noted. No carotid bruits.   Cardiovascular - Normal S1 and S2 without obvious murmur or gallop.     Extremities - No cyanosis, clubbing, or significant edema.    Pulmonary - No respiratory distress.  No wheezes or rales.    Pulses: Bilateral radial and pedal pulses normal  Abdomen - No masses, tenderness, or organomegaly.  Musculoskeletal - normal   Neurologic -   There are  no gross focal neurologic abnormalities.  Skin- normal  Affect; normal    DATA:    No results found for this basename: CKTOTAL,  CKMB,  CKMBINDEX,  TROPONINI     BNP:    No results found for this basename: BNP     PT/INR:    No components found with this basename: PTPATIENT,  PTINR     No results found for this basename: LABA1C     No results found for this basename: chol,  trig,  hdl,  ldlcalc,  ldldirect     No results found for this basename: ALT,  AST     TSH:    No results found for this basename: TSH       EKG  Labs, tests, notes reviewed    QUALITY MEASURES:  CAD:  No   CHOL LOWERING:  No- if No Why  ANTIPLATELET:  No - if No why  BETA BLOCKER    no  IF  NO WHY    Impression:            Patient Active Problem List   Diagnosis Code   ??? Hyperlipidemia 272.4   ??? Tachycardia 785.0   ??? V tach 427.1   ??? Swelling 782.3   ??? H/O 24 hour EKG monitoring V15.89   ??? Palpitations 785.1   ??? Frequent PVCs 427.69     @  Problem List Items Addressed This Visit     None          PLAN:     -  LIPID MANAGEMENT:  12. Diet advised,  13. Meds reviewed,Lipid profile reviewed with Pt  14. Changes:No  15. Test ordered today; No  No results found for this basename: chol,  HDL,  ldlcalcu,  LDLcholesterol,  LDLdirect                           No results found for this basename: ALKPHOS,  ALT,  AST,  PROT,  BILITOT,  BILIDIR,  LABALBU   - benign vt           FU in 12months

## 2013-10-05 ENCOUNTER — Other Ambulatory Visit: Payer: Self-pay | Admitting: Physician Assistant

## 2013-10-24 ENCOUNTER — Other Ambulatory Visit: Payer: Self-pay | Admitting: Internal Medicine

## 2013-10-24 ENCOUNTER — Other Ambulatory Visit: Payer: Self-pay | Admitting: Physician Assistant

## 2013-11-26 LAB — LIPID PANEL
Cholesterol, Total: 186
HDL: 55 mg/dL (ref 35–70)
LDL Calculated: 82 mg/dL (ref 0–160)
Triglycerides: 246 mg/dL

## 2013-11-28 ENCOUNTER — Encounter: Payer: Self-pay | Admitting: Physician Assistant

## 2013-12-10 ENCOUNTER — Other Ambulatory Visit: Payer: Self-pay | Admitting: Emergency Medicine

## 2013-12-11 ENCOUNTER — Encounter: Payer: Self-pay | Admitting: Physician Assistant

## 2013-12-30 NOTE — Progress Notes (Signed)
Carl Castro  1960/08/27    Chief Complaint   Patient presents with   ??? Surgical Consult     skin lesions       No past surgical history on file.    Past Medical History   Diagnosis Date   ??? Hyperlipidemia    ??? Tachycardia    ??? Palpitations    ??? History of complete ECG 09/08/2011   ??? Frequent PVCs    ??? H/O cardiovascular stress test 09/15/2011     09/15/2011-Normal study. No ischemia. LVSF normal. EF 63%.    ??? Family history of early CAD    ??? Atypical chest pain    ??? H/O echocardiogram 09/15/2011     09/15/2011- LVSF normal. EF 55%.Impaired LV relaxation. Mild TR.-report in epic   ??? S/P cardiac cath 11/11/2011     normal coronaries. reffer for EP study   ??? H/O 24 hour EKG monitoring 11/03/11     48 hour- the predominant rhythm is sinus rhythm with one 10-beat run of nonsustained ventricular tachycardia, the patient is already being referred for EP study to Dr. Dianah Field in [place]   ??? S/P radiofrequency ablation operation for arrhythmia        Current Outpatient Prescriptions   Medication Sig Dispense Refill   ??? metronidazole (METROGEL) 0.75 % gel Apply  topically daily. Apply topically 2 times daily.       ??? aspirin 81 MG tablet Take 81 mg by mouth daily.       ??? rosuvastatin (CRESTOR) 5 MG tablet Take 5 mg by mouth daily.         No current facility-administered medications for this visit.         Subjective:  Patient is a 53 year old white male who comes to the office today for me to evaluate a right temporal pigmented skin lesion along with another one in the mid back.  The lesion in the right temporal area looks like a seborrheic keratosis.  The back skin lesion has some hyperpigmentation and irregular borders.  There is no overlying erythematous changes with either lesion.  No ulcerations or any history of bleeding.    Objective: as above             Assessment/Plan:  I performed a punch biopsy on the back skin lesion which the patient tolerated well.  I will refer him to Dr. Bufford Buttner for possible surgical  excision of the right temporal pigmented skin lesion.    I have fully discussed the plan of treatment and answered all questions for the patient.

## 2014-01-12 ENCOUNTER — Other Ambulatory Visit: Payer: Self-pay | Admitting: Physician Assistant

## 2014-01-23 ENCOUNTER — Other Ambulatory Visit: Payer: Self-pay | Admitting: Physician Assistant

## 2014-01-23 ENCOUNTER — Ambulatory Visit (INDEPENDENT_AMBULATORY_CARE_PROVIDER_SITE_OTHER): Payer: BC Managed Care – PPO | Admitting: Physician Assistant

## 2014-01-23 ENCOUNTER — Encounter: Payer: Self-pay | Admitting: Physician Assistant

## 2014-01-23 ENCOUNTER — Ambulatory Visit (HOSPITAL_COMMUNITY)
Admission: RE | Admit: 2014-01-23 | Discharge: 2014-01-23 | Disposition: A | Discharge status: Home / Self Care | Payer: BC Managed Care – PPO | Source: Ambulatory Visit | Attending: Physician Assistant | Admitting: Physician Assistant

## 2014-01-23 ENCOUNTER — Telehealth: Payer: Self-pay | Admitting: Physician Assistant

## 2014-01-23 VITALS — BP 144/98 | HR 60 | Temp 98.1°F | Resp 16 | Ht 70.0 in | Wt 266.0 lb

## 2014-01-23 DIAGNOSIS — R911 Solitary pulmonary nodule: Secondary | ICD-10-CM

## 2014-01-23 DIAGNOSIS — M25572 Pain in left ankle and joints of left foot: Secondary | ICD-10-CM

## 2014-01-23 DIAGNOSIS — Z87891 Personal history of nicotine dependence: Secondary | ICD-10-CM | POA: Insufficient documentation

## 2014-01-23 DIAGNOSIS — Z0000 Encounter for general adult medical examination without abnormal findings: Secondary | ICD-10-CM

## 2014-01-23 DIAGNOSIS — Z8639 Personal history of other endocrine, nutritional and metabolic disease: Secondary | ICD-10-CM | POA: Insufficient documentation

## 2014-01-23 DIAGNOSIS — Z125 Encounter for screening for malignant neoplasm of prostate: Secondary | ICD-10-CM

## 2014-01-23 DIAGNOSIS — J984 Other disorders of lung: Secondary | ICD-10-CM | POA: Insufficient documentation

## 2014-01-23 DIAGNOSIS — I10 Essential (primary) hypertension: Secondary | ICD-10-CM

## 2014-01-23 DIAGNOSIS — G4733 Obstructive sleep apnea (adult) (pediatric): Secondary | ICD-10-CM

## 2014-01-23 DIAGNOSIS — R7303 Prediabetes: Secondary | ICD-10-CM

## 2014-01-23 DIAGNOSIS — E559 Vitamin D deficiency, unspecified: Secondary | ICD-10-CM

## 2014-01-23 DIAGNOSIS — E291 Testicular hypofunction: Secondary | ICD-10-CM | POA: Insufficient documentation

## 2014-01-23 DIAGNOSIS — R7309 Other abnormal glucose: Secondary | ICD-10-CM

## 2014-01-23 DIAGNOSIS — Z23 Encounter for immunization: Secondary | ICD-10-CM

## 2014-01-23 LAB — CBC WITH DIFFERENTIAL/PLATELET
BASOS ABS: 0 10*3/uL (ref 0.0–0.1)
Basophils Relative: 0 % (ref 0–1)
EOS ABS: 0.1 10*3/uL (ref 0.0–0.7)
EOS PCT: 1 % (ref 0–5)
HEMATOCRIT: 43.1 % (ref 39.0–52.0)
Hemoglobin: 15.1 g/dL (ref 13.0–17.0)
Lymphocytes Relative: 23 % (ref 12–46)
Lymphs Abs: 1.8 10*3/uL (ref 0.7–4.0)
MCH: 31.1 pg (ref 26.0–34.0)
MCHC: 35 g/dL (ref 30.0–36.0)
MCV: 88.9 fL (ref 78.0–100.0)
MONO ABS: 0.6 10*3/uL (ref 0.1–1.0)
Monocytes Relative: 7 % (ref 3–12)
Neutro Abs: 5.5 10*3/uL (ref 1.7–7.7)
Neutrophils Relative %: 69 % (ref 43–77)
PLATELETS: 211 10*3/uL (ref 150–400)
RBC: 4.85 MIL/uL (ref 4.22–5.81)
RDW: 14.3 % (ref 11.5–15.5)
WBC: 8 10*3/uL (ref 4.0–10.5)

## 2014-01-23 LAB — HEMOGLOBIN A1C
HEMOGLOBIN A1C: 5.4 % (ref <5.7)
Mean Plasma Glucose: 108 mg/dL (ref <117)

## 2014-01-23 MED ORDER — PRAVASTATIN SODIUM 40 MG PO TABS: 40.0000 mg | ORAL_TABLET | Freq: Every day | ORAL | Status: DC

## 2014-01-23 MED ORDER — TESTOSTERONE CYPIONATE 200 MG/ML IM SOLN: INTRAMUSCULAR | Status: DC

## 2014-01-23 NOTE — Telephone Encounter (Signed)
Dicussed chest xray results with the patient. We will schedule a CT chest without contrast, patient is aware.

## 2014-01-23 NOTE — Progress Notes (Signed)
Complete Physical  Assessment and Plan: Hypertension-- go back on Losartan 50mg  DASH diet, exercise and monitor at home. Call if greater than 130/80.   Hyperlipidemia--continue medications, check lipids, decrease fatty foods, increase activity.   Hypogonadism- continue replacement therapy, check testosterone levels as needed.   OSA (obstructive sleep apnea)-do weight loss and will send to Dr. Toni ArthursFuller, unable to tolerate a mask  Vitamin D deficiency--controlled, continue the same medications  Pre-diabetes-Discussed general issues about diabetes pathophysiology and management., Educational material distributed., Suggested low cholesterol diet., Encouraged aerobic exercise., Discussed foot care., Reminded to get yearly retinal exam.  Ex smoker- 2 years ago- will get CXR, last one 2009  Obesity with co morbidities- long discussion about weight loss, diet, and exercise Gout- check uric acid, not on treatment Edema- need to control BP, get on OSA treatment, weight loss, and will check CXR, he does not want a fluid pill at this time. History of noncompliance, needs 3 month f/u and only 3 month RXs Call Dr. Kenna GilbertMann;s office and see if over due for colonoscopy or due 2017 TDAP   Discussed med's effects and SE's. Screening labs and tests as requested with regular follow-up as recommended.  HPI 53 y.o. male  presents for a complete physical. 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 He denies chest pain, shortness of breath, dizziness.  He is on cholesterol medication and denies myalgias. His cholesterol is at goal. The cholesterol last visit was:  LDL 110 (146) He has been working on diet and exercise for prediabetes, and denies paresthesia of the feet, polydipsia and polyuria. Last A1C in the office was: A1C 5.8 Patient is  on Vitamin D supplement. He is has a history of testosterone deficiency and is on testosterone replacement, last test. was 298 and his shots were changed to 2 cc q 3 weeks but he is currently out of his medications. He states that the testosterone helps with his energy, libido, muscle mass. Patient was recently diagnosed with gout, last level 11.3, he is suppose to be on allopurinol for gout but is not, he was taken off HCTZ due to this and has not had a recent flare.  He has a diagnosis of sleep apnea, last study in 2009 but he has tried multiple attempts to wear a CPAP and difference masks but is unable to tolerate it.  Current Medications:  Current Outpatient Prescriptions on File Prior to Visit  Medication Sig Dispense Refill  . allopurinol (ZYLOPRIM) 300 MG tablet Take 1 tablet (300 mg total) by mouth daily.  90 tablet  1  . B-D 3CC LUER-LOK SYR 22GX1" 22G X 1" 3 ML MISC USE AS DIRECTED  100 each  3  . bisoprolol (ZEBETA) 5 MG tablet TAKE 1 TABLET EVERY DAY  90 tablet  0  . cholecalciferol (VITAMIN D) 1000 UNITS tablet Take 1,000 Units by mouth daily. 5,000iu daily      . colchicine 0.6 MG tablet TAKE 1 TABLET BY MOUTH EVERY DAY  30 tablet  2  . colchicine 0.6 MG tablet TAKE 1 TABLET BY MOUTH EVERY DAY  30 tablet  2  . hydrochlorothiazide (HYDRODIURIL) 25 MG tablet TAKE 1 TABLET (25 MG TOTAL) BY MOUTH DAILY.  90 tablet  1  . HYDROcodone-acetaminophen (NORCO) 5-325 MG per tablet Take 1  tablet by mouth every 6 (six) hours as needed for moderate pain.  30 tablet  0  . losartan (COZAAR) 50 MG tablet Take 50 mg by mouth daily.      . Magnesium 250 MG TABS Take by mouth.      . pantoprazole (PROTONIX) 40 MG tablet TAKE 1 TABLET EVERY DAY  90 tablet  0  . pravastatin (PRAVACHOL) 40 MG tablet Take 40 mg by mouth daily.      . predniSONE (STERAPRED UNI-PAK) 10 MG tablet 1 po TID x 3days, 1 po BID x 3 days, 1 PO QD x 5 days  20 tablet  1  . testosterone cypionate (DEPOTESTOTERONE CYPIONATE) 200 MG/ML  injection INJECT 2ML INTRAMUSCULAR EVERY 2 WEEKS  10 mL  1   No current facility-administered medications on file prior to visit.   Health Maintenance:  Tetanus: DUE Pneumovax: N/A Flu vaccine: N/A Zostavax: N/A DEXA: N/A Colonoscopy: 2007 and he had several polyps EGD: 01/2010 Dr. Loreta AveMann + GERD  Allergies: No Known Allergies Medical History:  Past Medical History  Diagnosis Date  . Hypertension   . Hyperlipidemia   . Other testicular hypofunction   . OSA (obstructive sleep apnea)   . Vitamin D deficiency disease   . Pre-diabetes   . Hypogonadism male    Surgical History: No past surgical history on file. Family History:  Family History  Problem Relation Age of Onset  . Diabetes Neg Hx   . Heart disease Neg Hx   . Hyperlipidemia Neg Hx   . Hypertension Neg Hx   . Stroke Neg Hx   . Cancer Neg Hx    Social History:   History  Substance Use Topics  . Smoking status: Former Smoker -- 1.00 packs/day    Start date: 08/08/2010  . Smokeless tobacco: Not on file  . Alcohol Use: Yes   ROS:  [X]  = complains of  [ ]  = denies  General: Fatigue Arly.Keller[X ] Fever [ ]  Chills [ ]  Weakness [ ]   Insomnia [ ]  Weight change [ ]  Night sweats [ ]   Change in appetite [ ]  Eyes: Redness [ ]  Blurred vision [ ]  Diplopia [ ]  Discharge [ ]   ENT: Congestion [ ]  Sinus Pain [ ]  Post Nasal Drip [ ]  Sore Throat [ ]  Earache [ ]  hearing loss [ ]  Tinnitus [ ]  Snoring Arly.Keller[X ]  Cardiac: Chest pain/pressure [ ]  SOB [ ]  Orthopnea [ ]   Palpitations [ ]   Paroxysmal nocturnal dyspnea[ ]  Claudication [ ]  Edema [ ]   Pulmonary: Cough [ ]  Wheezing[ ]   SOB [ ]   Pleurisy [ ]   GI: Nausea [ ]  Vomiting[ ]  Dysphagia[ ]  Heartburn[ ]  Abdominal pain [ ]  Constipation [ ] ; Diarrhea [ ]  BRBPR [ ]  Melena[ ]  Bloating [ ]  Hemorrhoids [ ]   GU: Hematuria[ ]  Dysuria [ ]  Nocturia[ ]  Urgency [ ]   Hesitancy [ ]  Discharge [ ]  Frequency [ ]   Neuro: Headaches[ ]  Vertigo[ ]  Paresthesias[ ]  Spasm [ ]  Speech changes [ ]  Incoordination [ ]   Ortho:  Arthritis [ ]  Joint pain [ ]  Muscle pain [ ]  Joint swelling [ ]  Back Pain [ ]  Skin:  Rash [ ]   Pruritis [ ]  Change in skin lesion [ ]   Psych: Depression[ ]  Anxiety[ ]  Confusion [ ]  Memory loss [ ]   Heme/Lypmh: Bleeding [ ]  Bruising [ ]  Enlarged lymph nodes [ ]   Endocrine: Visual blurring [ ]  Paresthesia [ ]  Polyuria [ ]  Polydypsea [ ]   Heat/cold intolerance [ ]  Hypoglycemia [ ]   Physical Exam: Estimated body mass index is 38.17 kg/(m^2) as calculated from the following:   Height as of this encounter: 5\' 10"  (1.778 m).   Weight as of this encounter: 266 lb (120.657 kg). BP 144/98  Pulse 60  Temp(Src) 98.1 F (36.7 C)  Resp 16  Ht 5\' 10"  (1.778 m)  Wt 266 lb (120.657 kg)  BMI 38.17 kg/m2 General Appearance: Well nourished, overweight, in no apparent distress. Eyes: PERRLA, EOMs, conjunctiva no swelling or erythema, normal fundi and vessels. Sinuses: No Frontal/maxillary tenderness ENT/Mouth: Ext aud canals clear, normal light reflex with TMs without erythema, bulging. Good dentition. Very crowded mouth and very difficult to visualize post pharynx.  Hearing normal.  Neck: Supple, thyroid normal. No bruits Respiratory: Respiratory effort normal, BS equal bilaterally without rales, rhonchi, wheezing or stridor. Cardio: RRR without murmurs, rubs or gallops. Brisk peripheral pulses with 1-2 +edema.  Chest: symmetric, with normal excursions and percussion. Abdomen: Soft, +BS, rotund, Non tender, no guarding, rebound, hernias, masses, or organomegaly. .  Lymphatics: Non tender without lymphadenopathy.  Genitourinary: defer Musculoskeletal: Full ROM all peripheral extremities,5/5 strength, and normal gait. Skin: Warm, dry without rashes, lesions, ecchymosis.  Neuro: Cranial nerves intact, reflexes equal bilaterally. Normal muscle tone, no cerebellar symptoms. Sensation intact.  Psych: Awake and oriented X 3, normal affect, Insight and Judgment appropriate.   EKG: WNL, questionable LAE, no  ST changes. AORTA SCAN: WNL  Quentin Mulling 10:18 AM

## 2014-01-23 NOTE — Patient Instructions (Addendum)
Dr. Loreta Ave, GI, Phone: [phone]; Please call and find out when you are suppose to have colonoscopy  Stay on the bisoprolol and get back on the losartan Elevate your feet 2 times daily above your heart for the swelling in her legs and you can get compression socks/stockings from [employer] or [facility] to wear to work.   Can call Dr. Toni Arthurs to get evaluated for dental sleep appliance. We will send notes. # [id number]  Preventative Care for Adults, Male       REGULAR HEALTH EXAMS:  A routine yearly physical is a good way to check in with your primary care [place] about your health and preventive screening. It is also an opportunity to share updates about your health and any concerns you have, and receive a thorough all-over exam.   Most health insurance companies pay for at least some preventative services.  Check with your health plan for specific coverages.  WHAT PREVENTATIVE SERVICES DO MEN NEED?  Adult men should have their weight and blood pressure checked regularly.   Men age 69 and older should have their cholesterol levels checked regularly.  Beginning at age 56 and continuing to age 71, men should be screened for colorectal cancer.  Certain people should may need continued testing until age 17.  Other cancer screening may include exams for testicular and prostate cancer.  Updating vaccinations is part of preventative care.  Vaccinations help protect against diseases such as the flu.  Lab tests are generally done as part of preventative care to screen for anemia and blood disorders, to screen for problems with the kidneys and liver, to screen for bladder problems, to check blood sugar, and to check your cholesterol level.  Preventative services generally include counseling about diet, exercise, avoiding tobacco, drugs, excessive alcohol consumption, and sexually transmitted infections.    GENERAL RECOMMENDATIONS FOR GOOD HEALTH:  Healthy diet:  Eat a variety of foods, including  fruit, vegetables, animal or vegetable protein, such as meat, fish, chicken, and eggs, or beans, lentils, tofu, and grains, such as rice.  Drink plenty of water daily.  Decrease saturated fat in the diet, avoid lots of red meat, processed foods, sweets, fast foods, and fried foods.  Exercise:  Aerobic exercise helps maintain good heart health. At least 30-40 minutes of moderate-intensity exercise is recommended. For example, a brisk walk that increases your heart rate and breathing. This should be done on most days of the week.   Find a type of exercise or a variety of exercises that you enjoy so that it becomes a part of your daily life.  Examples are running, walking, swimming, water aerobics, and biking.  For motivation and support, explore group exercise such as aerobic class, spin class, Zumba, Yoga,or  martial arts, etc.    Set exercise goals for yourself, such as a certain weight goal, walk or run in a race such as a 5k walk/run.  Speak to your primary care Tyrel Lex about exercise goals.  Disease prevention:  If you smoke or chew tobacco, find out from your caregiver how to quit. It can literally save your life, no matter how long you have been a tobacco user. If you do not use tobacco, never begin.   Maintain a healthy diet and normal weight. Increased weight leads to problems with blood pressure and diabetes.   The Body Mass Index or BMI is a way of measuring how much of your body is fat. Having a BMI above 27 increases the risk of heart disease,  diabetes, hypertension, stroke and other problems related to obesity. Your caregiver can help determine your BMI and based on it develop an exercise and dietary program to help you achieve or maintain this important measurement at a healthful level.  High blood pressure causes heart and blood vessel problems.  Persistent high blood pressure should be treated with medicine if weight loss and exercise do not work.   Fat and cholesterol leaves  deposits in your arteries that can block them. This causes heart disease and vessel disease elsewhere in your body.  If your cholesterol is found to be high, or if you have heart disease or certain other medical conditions, then you may need to have your cholesterol monitored frequently and be treated with medication.   Ask if you should have a stress test if your history suggests this. A stress test is a test done on a treadmill that looks for heart disease. This test can find disease prior to there being a problem.  Avoid drinking alcohol in excess (more than two drinks per day).  Avoid use of street drugs. Do not share needles with anyone. Ask for professional help if you need assistance or instructions on stopping the use of alcohol, cigarettes, and/or drugs.  Brush your teeth twice a day with fluoride toothpaste, and floss once a day. Good oral hygiene prevents tooth decay and gum disease. The problems can be painful, unattractive, and can cause other health problems. Visit your dentist for a routine oral and dental check up and preventive care every 6-12 months.   Look at your skin regularly.  Use a mirror to look at your back. Notify your caregivers of changes in moles, especially if there are changes in shapes, colors, a size larger than a pencil eraser, an irregular border, or development of new moles.  Safety:  Use seatbelts 100% of the time, whether driving or as a passenger.  Use safety devices such as hearing protection if you work in environments with loud noise or significant background noise.  Use safety glasses when doing any work that could send debris in to the eyes.  Use a helmet if you ride a bike or motorcycle.  Use appropriate safety gear for contact sports.  Talk to your caregiver about gun safety.  Use sunscreen with a SPF (or skin protection factor) of 15 or greater.  Lighter skinned people are at a greater risk of skin cancer. Don't forget to also wear sunglasses in order to  protect your eyes from too much damaging sunlight. Damaging sunlight can accelerate cataract formation.   Practice safe sex. Use condoms. Condoms are used for birth control and to help reduce the spread of sexually transmitted infections (or STIs).  Some of the STIs are gonorrhea (the clap), chlamydia, syphilis, trichomonas, herpes, HPV (human papilloma virus) and HIV (human immunodeficiency virus) which causes AIDS. The herpes, HIV and HPV are viral illnesses that have no cure. These can result in disability, cancer and death.   Keep carbon monoxide and smoke detectors in your home functioning at all times. Change the batteries every 6 months or use a model that plugs into the wall.   Vaccinations:  Stay up to date with your tetanus shots and other required immunizations. You should have a booster for tetanus every 10 years. Be sure to get your flu shot every year, since 5%-20% of the [place] population comes down with the flu. The flu vaccine changes each year, so being vaccinated once is not enough. Get your  shot in the fall, before the flu season peaks.   Other vaccines to consider:  Pneumococcal vaccine to protect against certain types of pneumonia.  This is normally recommended for adults age 54 or older.  However, adults younger than 54 years old with certain underlying conditions such as diabetes, heart or lung disease should also receive the vaccine.  Shingles vaccine to protect against Varicella Zoster if you are older than age 54, or younger than 54 years old with certain underlying illness.  Hepatitis A vaccine to protect against a form of infection of the liver by a virus acquired from food.  Hepatitis B vaccine to protect against a form of infection of the liver by a virus acquired from blood or body fluids, particularly if you work in health care.  If you plan to travel internationally, check with your local health department for specific vaccination recommendations.  Cancer  Screening:  Most routine colon cancer screening begins at the age of 54. On a yearly basis, doctors may provide special easy to use take-home tests to check for hidden blood in the stool. Sigmoidoscopy or colonoscopy can detect the earliest forms of colon cancer and is life saving. These tests use a small camera at the end of a tube to directly examine the colon. Speak to your caregiver about this at age 650, when routine screening begins (and is repeated every 5 years unless early forms of pre-cancerous polyps or small growths are found).   At the age of 54 men usually start screening for prostate cancer every year. Screening may begin at a younger age for those with higher risk. Those at higher risk include African-Americans or having a family history of prostate cancer. There are two types of tests for prostate cancer:   Prostate-specific antigen (PSA) testing. Recent studies raise questions about prostate cancer using PSA and you should discuss this with your caregiver.   Digital rectal exam (in which your doctor's lubricated and gloved finger feels for enlargement of the prostate through the anus).   Screening for testicular cancer.  Do a monthly exam of your testicles. Gently roll each testicle between your thumb and fingers, feeling for any abnormal lumps. The best time to do this is after a hot shower or bath when the tissues are looser. Notify your caregivers of any lumps, tenderness or changes in size or shape immediately.

## 2014-01-24 ENCOUNTER — Ambulatory Visit
Admission: RE | Admit: 2014-01-24 | Discharge: 2014-01-24 | Disposition: A | Discharge status: Home / Self Care | Payer: BC Managed Care – PPO | Source: Ambulatory Visit | Attending: Physician Assistant | Admitting: Physician Assistant

## 2014-01-24 DIAGNOSIS — R911 Solitary pulmonary nodule: Secondary | ICD-10-CM

## 2014-01-24 DIAGNOSIS — Z87891 Personal history of nicotine dependence: Secondary | ICD-10-CM

## 2014-01-24 LAB — HEPATIC FUNCTION PANEL
ALBUMIN: 4.1 g/dL (ref 3.5–5.2)
ALK PHOS: 74 U/L (ref 39–117)
ALT: 14 U/L (ref 0–53)
AST: 18 U/L (ref 0–37)
BILIRUBIN TOTAL: 0.5 mg/dL (ref 0.2–1.2)
Bilirubin, Direct: 0.1 mg/dL (ref 0.0–0.3)
Indirect Bilirubin: 0.4 mg/dL (ref 0.2–1.2)
TOTAL PROTEIN: 6.9 g/dL (ref 6.0–8.3)

## 2014-01-24 LAB — TSH: TSH: 0.973 u[IU]/mL (ref 0.350–4.500)

## 2014-01-24 LAB — URINALYSIS, ROUTINE W REFLEX MICROSCOPIC
Bilirubin Urine: NEGATIVE
Glucose, UA: NEGATIVE mg/dL
HGB URINE DIPSTICK: NEGATIVE
KETONES UR: NEGATIVE mg/dL
LEUKOCYTES UA: NEGATIVE
NITRITE: NEGATIVE
PROTEIN: NEGATIVE mg/dL
Specific Gravity, Urine: 1.02 (ref 1.005–1.030)
UROBILINOGEN UA: 0.2 mg/dL (ref 0.0–1.0)
pH: 6 (ref 5.0–8.0)

## 2014-01-24 LAB — LIPID PANEL
CHOL/HDL RATIO: 3.4 ratio
CHOLESTEROL: 210 mg/dL — AB (ref 0–200)
HDL: 61 mg/dL (ref >39)
LDL Cholesterol: 128 mg/dL — ABNORMAL HIGH (ref 0–99)
Triglycerides: 103 mg/dL (ref <150)
VLDL: 21 mg/dL (ref 0–40)

## 2014-01-24 LAB — INSULIN, FASTING: INSULIN FASTING, SERUM: 36 u[IU]/mL — AB (ref 3–28)

## 2014-01-24 LAB — BASIC METABOLIC PANEL WITH GFR
BUN: 11 mg/dL (ref 6–23)
CALCIUM: 9 mg/dL (ref 8.4–10.5)
CHLORIDE: 104 meq/L (ref 96–112)
CO2: 26 meq/L (ref 19–32)
CREATININE: 1.1 mg/dL (ref 0.50–1.35)
GFR, EST NON AFRICAN AMERICAN: 76 mL/min
GFR, Est African American: 88 mL/min
Glucose, Bld: 77 mg/dL (ref 70–99)
Potassium: 4.4 mEq/L (ref 3.5–5.3)
Sodium: 141 mEq/L (ref 135–145)

## 2014-01-24 LAB — IRON AND TIBC
%SAT: 31 % (ref 20–55)
IRON: 102 ug/dL (ref 42–165)
TIBC: 329 ug/dL (ref 215–435)
UIBC: 227 ug/dL (ref 125–400)

## 2014-01-24 LAB — VITAMIN D 25 HYDROXY (VIT D DEFICIENCY, FRACTURES): VIT D 25 HYDROXY: 36 ng/mL (ref 30–89)

## 2014-01-24 LAB — MICROALBUMIN / CREATININE URINE RATIO
Creatinine, Urine: 138 mg/dL
Microalb Creat Ratio: 3.6 mg/g (ref 0.0–30.0)
Microalb, Ur: 0.5 mg/dL (ref 0.00–1.89)

## 2014-01-24 LAB — URIC ACID: Uric Acid, Serum: 8.9 mg/dL — ABNORMAL HIGH (ref 4.0–7.8)

## 2014-01-24 LAB — PSA: PSA: 0.8 ng/mL (ref <=4.00)

## 2014-01-24 LAB — TESTOSTERONE: Testosterone: 341 ng/dL (ref 300–890)

## 2014-01-24 LAB — MAGNESIUM: Magnesium: 1.8 mg/dL (ref 1.5–2.5)

## 2014-01-24 LAB — VITAMIN B12: Vitamin B-12: 387 pg/mL (ref 211–911)

## 2014-01-24 MED ORDER — PREDNISONE (PAK) 10 MG PO TABS: ORAL_TABLET | ORAL | Status: DC

## 2014-01-24 MED ORDER — ALLOPURINOL 300 MG PO TABS: 300.0000 mg | ORAL_TABLET | Freq: Every day | ORAL | Status: DC

## 2014-01-24 NOTE — Addendum Note (Signed)
Addended by: Quentin MullingOLLIER, Lynnell Fiumara R on: 01/24/2014 08:37 AM   Modules accepted: Orders

## 2014-01-26 ENCOUNTER — Telehealth: Payer: Self-pay | Admitting: Physician Assistant

## 2014-01-26 NOTE — Telephone Encounter (Signed)
Patient informed of negative chest CT, emphasized the importance of remaining tobacco free and follow up appointments.

## 2014-03-06 ENCOUNTER — Ambulatory Visit: Payer: Self-pay | Admitting: Physician Assistant

## 2014-03-08 ENCOUNTER — Other Ambulatory Visit: Payer: Self-pay | Admitting: Emergency Medicine

## 2014-03-17 ENCOUNTER — Encounter: Payer: Self-pay | Admitting: Physician Assistant

## 2014-03-17 ENCOUNTER — Ambulatory Visit (INDEPENDENT_AMBULATORY_CARE_PROVIDER_SITE_OTHER): Payer: BC Managed Care – PPO | Admitting: Physician Assistant

## 2014-03-17 VITALS — BP 110/78 | HR 60 | Temp 97.9°F | Resp 16 | Wt 257.0 lb

## 2014-03-17 DIAGNOSIS — M109 Gout, unspecified: Secondary | ICD-10-CM

## 2014-03-17 DIAGNOSIS — Z79899 Other long term (current) drug therapy: Secondary | ICD-10-CM

## 2014-03-17 DIAGNOSIS — I10 Essential (primary) hypertension: Secondary | ICD-10-CM

## 2014-03-17 LAB — CBC WITH DIFFERENTIAL/PLATELET
Basophils Absolute: 0 10*3/uL (ref 0.0–0.1)
Basophils Relative: 0 % (ref 0–1)
EOS PCT: 2 % (ref 0–5)
Eosinophils Absolute: 0.2 10*3/uL (ref 0.0–0.7)
HCT: 43.1 % (ref 39.0–52.0)
HEMOGLOBIN: 15.3 g/dL (ref 13.0–17.0)
LYMPHS ABS: 2.6 10*3/uL (ref 0.7–4.0)
Lymphocytes Relative: 27 % (ref 12–46)
MCH: 31.5 pg (ref 26.0–34.0)
MCHC: 35.5 g/dL (ref 30.0–36.0)
MCV: 88.9 fL (ref 78.0–100.0)
MONOS PCT: 9 % (ref 3–12)
Monocytes Absolute: 0.9 10*3/uL (ref 0.1–1.0)
NEUTROS PCT: 62 % (ref 43–77)
Neutro Abs: 6 10*3/uL (ref 1.7–7.7)
Platelets: 257 10*3/uL (ref 150–400)
RBC: 4.85 MIL/uL (ref 4.22–5.81)
RDW: 13.6 % (ref 11.5–15.5)
WBC: 9.7 10*3/uL (ref 4.0–10.5)

## 2014-03-17 MED ORDER — PANTOPRAZOLE SODIUM 40 MG PO TBEC: DELAYED_RELEASE_TABLET | ORAL | Status: DC

## 2014-03-17 MED ORDER — LOSARTAN POTASSIUM 50 MG PO TABS: 50.0000 mg | ORAL_TABLET | Freq: Every day | ORAL | Status: DC

## 2014-03-17 NOTE — Patient Instructions (Signed)
You are doing great with weight loss, keep it up.  We are going to recheck your uric acid, if it is still elevated I want you to start 1/2 of the white pill Allopurinol, this can cause gout flares, call if this happens.  Your blood pressure is doing great, you can cut the purple pill, bispropolol in half for 1 week and then stop it after that if your BP is below 140/90.     Bad carbs also include fruit juice, alcohol, and sweet tea. These are empty calories that do not signal to your brain that you are full.   Please remember the good carbs are still carbs which convert into sugar. So please measure them out no more than 1/2-1 cup of rice, oatmeal, pasta, and beans.  Veggies are however free foods! Pile them on.   I like lean protein at every meal such as chicken, [place]key, pork chops, cottage cheese, etc. Just do not fry these meats and please center your meal around vegetable, the meats should be a side dish.   No all fruit is created equal. Please see the list below, the fruit at the bottom is higher in sugars than the fruit at the top

## 2014-03-17 NOTE — Progress Notes (Signed)
HPI 53 y.o.male presents for follow up for HTN, uric acid elevation. He was start on losartan for BP, and his BP is doing much better. He complains of ED, we will slowly decrease his ziac and give him samples to see if this helps. He did not start the allopuinol. He has lost close to 10 lbs and would like to try to avoid taking medications if possible.   Blood pressure 110/78, pulse 60, temperature 97.9 F (36.6 C), resp. rate 16, weight 257 lb (116.574 kg).   Past Medical History  Diagnosis Date  . Hypertension   . Hyperlipidemia   . Other testicular hypofunction   . OSA (obstructive sleep apnea)   . Vitamin D deficiency disease   . Pre-diabetes   . Hypogonadism male      No Known Allergies    Current Outpatient Prescriptions on File Prior to Visit  Medication Sig Dispense Refill  . allopurinol (ZYLOPRIM) 300 MG tablet Take 1 tablet (300 mg total) by mouth daily.  90 tablet  0  . B-D 3CC LUER-LOK SYR 22GX1" 22G X 1" 3 ML MISC USE AS DIRECTED  100 each  3  . bisoprolol (ZEBETA) 5 MG tablet TAKE 1 TABLET EVERY DAY  90 tablet  0  . cholecalciferol (VITAMIN D) 1000 UNITS tablet Take 1,000 Units by mouth daily. 5,000iu daily      . hydrochlorothiazide (HYDRODIURIL) 25 MG tablet TAKE 1 TABLET EVERY DAY  90 tablet  1  . losartan (COZAAR) 50 MG tablet Take 50 mg by mouth daily.      . Magnesium 250 MG TABS Take by mouth.      . pantoprazole (PROTONIX) 40 MG tablet TAKE 1 TABLET EVERY DAY  90 tablet  0  . pravastatin (PRAVACHOL) 40 MG tablet Take 1 tablet (40 mg total) by mouth daily.  90 tablet  0  . testosterone cypionate (DEPOTESTOTERONE CYPIONATE) 200 MG/ML injection INJECT 2ML INTRAMUSCULAR EVERY 2 WEEKS  10 mL  1   No current facility-administered medications on file prior to visit.    ROS: all negative expect above.   Physical: Wt Readings from Last 3 Encounters:  03/17/14 257 lb (116.574 kg)  01/23/14 266 lb (120.657 kg)  06/17/13 242 lb (109.77 kg)    BP 110/78  Pulse  60  Temp(Src) 97.9 F (36.6 C)  Resp 16  Wt 257 lb (116.574 kg) General Appearance: Well nourished, in no apparent distress. Eyes: PERRLA, EOMs. Sinuses: No Frontal/maxillary tenderness ENT/Mouth: Ext aud canals clear, normal light reflex with TMs without erythema, bulging. Post pharynx without erythema, swelling, exudate.  Respiratory: CTAB Cardio: RRR, no murmurs, rubs or gallops. Peripheral pulses brisk and equal bilaterally, without edema. No aortic or femoral bruits. Abdomen: Soft, with bowl sounds. Nontender, no guarding, rebound. Lymphatics: Non tender without lymphadenopathy.  Musculoskeletal: Full ROM all peripheral extremities, 5/5 strength, and normal gait. Skin: Warm, dry without rashes, lesions, ecchymosis.  Neuro: Cranial nerves intact, reflexes equal bilaterally. Normal muscle tone, no cerebellar symptoms. Sensation intact.  Pysch: Awake and oriented X 3, normal affect, Insight and Judgment appropriate.   Assessment and Plan: HTN- continue medications, can decrease/try to d/c ziac due to ED, monitor BP at home, call if above 140/90.  ED- viagra samples given, continue weight loss, and can try a trial of ziac Gout/psudogout flare- recheck Uric acid, if still elevated with weight loss he will start allopurinol.  Obesity with co morbidities- long discussion about weight loss, diet, and exercise

## 2014-03-18 LAB — BASIC METABOLIC PANEL WITH GFR
BUN: 14 mg/dL (ref 6–23)
CO2: 26 meq/L (ref 19–32)
Calcium: 9 mg/dL (ref 8.4–10.5)
Chloride: 104 mEq/L (ref 96–112)
Creat: 1.05 mg/dL (ref 0.50–1.35)
GFR, Est African American: >89 mL/min
GFR, Est Non African American: 81 mL/min
GLUCOSE: 86 mg/dL (ref 70–99)
Potassium: 4.4 mEq/L (ref 3.5–5.3)
SODIUM: 141 meq/L (ref 135–145)

## 2014-03-18 LAB — HEPATIC FUNCTION PANEL
ALT: 15 U/L (ref 0–53)
AST: 17 U/L (ref 0–37)
Albumin: 4.4 g/dL (ref 3.5–5.2)
Alkaline Phosphatase: 80 U/L (ref 39–117)
Bilirubin, Direct: 0.1 mg/dL (ref 0.0–0.3)
Indirect Bilirubin: 0.4 mg/dL (ref 0.2–1.2)
Total Bilirubin: 0.5 mg/dL (ref 0.2–1.2)
Total Protein: 6.8 g/dL (ref 6.0–8.3)

## 2014-03-18 LAB — MAGNESIUM: Magnesium: 1.7 mg/dL (ref 1.5–2.5)

## 2014-03-18 LAB — URIC ACID: Uric Acid, Serum: 8.3 mg/dL — ABNORMAL HIGH (ref 4.0–7.8)

## 2014-04-04 ENCOUNTER — Other Ambulatory Visit: Payer: Self-pay | Admitting: Physician Assistant

## 2014-06-22 ENCOUNTER — Other Ambulatory Visit: Payer: Self-pay | Admitting: Physician Assistant

## 2014-06-23 ENCOUNTER — Other Ambulatory Visit: Payer: Self-pay | Admitting: *Deleted

## 2014-06-23 MED ORDER — PRAVASTATIN SODIUM 40 MG PO TABS: 40.0000 mg | ORAL_TABLET | Freq: Every day | ORAL | Status: DC

## 2014-07-23 ENCOUNTER — Ambulatory Visit: Payer: Self-pay | Admitting: Physician Assistant

## 2014-07-28 ENCOUNTER — Other Ambulatory Visit: Payer: Self-pay | Admitting: Internal Medicine

## 2014-07-29 ENCOUNTER — Ambulatory Visit: Payer: Self-pay | Admitting: Physician Assistant

## 2014-08-05 ENCOUNTER — Ambulatory Visit (INDEPENDENT_AMBULATORY_CARE_PROVIDER_SITE_OTHER): Payer: BC Managed Care – PPO | Admitting: Physician Assistant

## 2014-08-05 ENCOUNTER — Other Ambulatory Visit: Payer: Self-pay | Admitting: Internal Medicine

## 2014-08-05 VITALS — BP 120/82 | HR 60 | Temp 97.9°F | Resp 16 | Ht 70.0 in | Wt 249.0 lb

## 2014-08-05 DIAGNOSIS — N529 Male erectile dysfunction, unspecified: Secondary | ICD-10-CM | POA: Insufficient documentation

## 2014-08-05 DIAGNOSIS — B353 Tinea pedis: Secondary | ICD-10-CM

## 2014-08-05 DIAGNOSIS — E782 Mixed hyperlipidemia: Secondary | ICD-10-CM

## 2014-08-05 DIAGNOSIS — E669 Obesity, unspecified: Secondary | ICD-10-CM

## 2014-08-05 DIAGNOSIS — R7303 Prediabetes: Secondary | ICD-10-CM

## 2014-08-05 DIAGNOSIS — M722 Plantar fascial fibromatosis: Secondary | ICD-10-CM

## 2014-08-05 DIAGNOSIS — Z79899 Other long term (current) drug therapy: Secondary | ICD-10-CM

## 2014-08-05 DIAGNOSIS — I10 Essential (primary) hypertension: Secondary | ICD-10-CM

## 2014-08-05 DIAGNOSIS — E291 Testicular hypofunction: Secondary | ICD-10-CM

## 2014-08-05 DIAGNOSIS — M109 Gout, unspecified: Secondary | ICD-10-CM

## 2014-08-05 DIAGNOSIS — R7309 Other abnormal glucose: Secondary | ICD-10-CM

## 2014-08-05 DIAGNOSIS — E559 Vitamin D deficiency, unspecified: Secondary | ICD-10-CM

## 2014-08-05 LAB — BASIC METABOLIC PANEL WITH GFR
BUN: 17 mg/dL (ref 6–23)
CO2: 29 meq/L (ref 19–32)
CREATININE: 1.19 mg/dL (ref 0.50–1.35)
Calcium: 9.5 mg/dL (ref 8.4–10.5)
Chloride: 104 mEq/L (ref 96–112)
GFR, EST NON AFRICAN AMERICAN: 69 mL/min
GFR, Est African American: 80 mL/min
Glucose, Bld: 93 mg/dL (ref 70–99)
Potassium: 4.6 mEq/L (ref 3.5–5.3)
SODIUM: 139 meq/L (ref 135–145)

## 2014-08-05 LAB — CBC WITH DIFFERENTIAL/PLATELET
Basophils Absolute: 0 10*3/uL (ref 0.0–0.1)
Basophils Relative: 0 % (ref 0–1)
EOS ABS: 0.1 10*3/uL (ref 0.0–0.7)
Eosinophils Relative: 2 % (ref 0–5)
HCT: 45.9 % (ref 39.0–52.0)
Hemoglobin: 16 g/dL (ref 13.0–17.0)
LYMPHS PCT: 29 % (ref 12–46)
Lymphs Abs: 2.1 10*3/uL (ref 0.7–4.0)
MCH: 31.3 pg (ref 26.0–34.0)
MCHC: 34.9 g/dL (ref 30.0–36.0)
MCV: 89.8 fL (ref 78.0–100.0)
MONO ABS: 0.6 10*3/uL (ref 0.1–1.0)
MONOS PCT: 8 % (ref 3–12)
MPV: 9.9 fL (ref 8.6–12.4)
Neutro Abs: 4.4 10*3/uL (ref 1.7–7.7)
Neutrophils Relative %: 61 % (ref 43–77)
Platelets: 270 10*3/uL (ref 150–400)
RBC: 5.11 MIL/uL (ref 4.22–5.81)
RDW: 13 % (ref 11.5–15.5)
WBC: 7.2 10*3/uL (ref 4.0–10.5)

## 2014-08-05 LAB — HEPATIC FUNCTION PANEL
ALBUMIN: 4.3 g/dL (ref 3.5–5.2)
ALK PHOS: 72 U/L (ref 39–117)
ALT: 20 U/L (ref 0–53)
AST: 19 U/L (ref 0–37)
BILIRUBIN TOTAL: 0.8 mg/dL (ref 0.2–1.2)
Bilirubin, Direct: 0.1 mg/dL (ref 0.0–0.3)
Indirect Bilirubin: 0.7 mg/dL (ref 0.2–1.2)
Total Protein: 7 g/dL (ref 6.0–8.3)

## 2014-08-05 LAB — LIPID PANEL
CHOL/HDL RATIO: 4.6 ratio
CHOLESTEROL: 243 mg/dL — AB (ref 0–200)
HDL: 53 mg/dL (ref >39)
LDL Cholesterol: 165 mg/dL — ABNORMAL HIGH (ref 0–99)
Triglycerides: 125 mg/dL (ref <150)
VLDL: 25 mg/dL (ref 0–40)

## 2014-08-05 LAB — URIC ACID: Uric Acid, Serum: 9.7 mg/dL — ABNORMAL HIGH (ref 4.0–7.8)

## 2014-08-05 LAB — MAGNESIUM: Magnesium: 1.9 mg/dL (ref 1.5–2.5)

## 2014-08-05 MED ORDER — NYSTATIN 100000 UNIT/GM EX CREA: 1.0000 "application " | TOPICAL_CREAM | Freq: Two times a day (BID) | CUTANEOUS | Status: DC

## 2014-08-05 MED ORDER — SILDENAFIL CITRATE 100 MG PO TABS: 100.0000 mg | ORAL_TABLET | ORAL | Status: DC | PRN

## 2014-08-05 MED ORDER — SILDENAFIL CITRATE 100 MG PO TABS: ORAL_TABLET | ORAL | Status: DC

## 2014-08-05 NOTE — Patient Instructions (Addendum)
Stay on the losartan 50mg , stay off the 5 mg bisoprolol and the fluid pill. If you continue to lose weight and have a blood pressure below 120/70 then cut the losartan in half.   Continue 1/2 of the allopurinol for now.   Ph[facility] [place] [phone] Can call this number to try to get the Viagra for much cheaper.      Bad carbs also include fruit juice, alcohol, and sweet tea. These are empty calories that do not signal to your brain that you are full.   Please remember the good carbs are still carbs which convert into sugar. So please measure them out no more than 1/2-1 cup of rice, oatmeal, pasta, and beans  Veggies are however free foods! Pile them on.   Not all fruit is created equal. Please see the list below, the fruit at the bottom is higher in sugars than the fruit at the top. Please avoid all dried fruits.     Plantar Fasciitis (Heel Spur Syndrome) with Rehab The plantar fascia is a fibrous, ligament-like, soft-tissue structure that spans the bottom of the foot. Plantar fasciitis is a condition that causes pain in the foot due to inflammation of the tissue. SYMPTOMS  5. Pain and tenderness on the underneath side of the foot. 6. Pain that worsens with standing or walking. CAUSES  Plantar fasciitis is caused by irritation and injury to the plantar fascia on the underneath side of the foot. Common mechanisms of injury include:  Direct trauma to bottom of the foot.  Damage to a small nerve that runs under the foot where the main fascia attaches to the heel bone.  Stress placed on the plantar fascia due to bone spurs. RISK INCREASES WITH:   Activities that place stress on the plantar fascia (running, jumping, pivoting, or cutting).  Poor strength and flexibility.  Improperly fitted shoes.  Tight calf muscles.  Flat feet.  Failure to warm-up properly before activity.  Obesity. PREVENTION  Warm up and stretch properly before activity.  Allow for adequate  recovery between workouts.  Maintain physical fitness:  Strength, flexibility, and endurance.  Cardiovascular fitness.  Maintain a health body weight.  Avoid stress on the plantar fascia.  Wear properly fitted shoes, including arch supports for individuals who have flat feet. PROGNOSIS  If treated properly, then the symptoms of plantar fasciitis usually resolve without surgery. However, occasionally surgery is necessary. RELATED COMPLICATIONS   Recurrent symptoms that may result in a chronic condition.  Problems of the lower back that are caused by compensating for the injury, such as limping.  Pain or weakness of the foot during push-off following surgery.  Chronic inflammation, scarring, and partial or complete fascia tear, occurring more often from repeated injections. TREATMENT  Treatment initially involves the use of ice and medication to help reduce pain and inflammation. The use of strengthening and stretching exercises may help reduce pain with activity, especially stretches of the Achilles tendon. These exercises may be performed at home or with a therapist. Your caregiver may recommend that you use heel cups of arch supports to help reduce stress on the plantar fascia. Occasionally, corticosteroid injections are given to reduce inflammation. If symptoms persist for greater than 6 months despite non-surgical (conservative), then surgery may be recommended.  MEDICATION   If pain medication is necessary, then nonsteroidal anti-inflammatory medications, such as aspirin and ibuprofen, or other minor pain relievers, such as acetaminophen, are often recommended.  Do not take pain medication within 7 days before  surgery.  Prescription pain relievers may be given if deemed necessary by your caregiver. Use only as directed and only as much as you need.  Corticosteroid injections may be given by your caregiver. These injections should be reserved for the most serious cases, because  they may only be given a certain number of times. HEAT AND COLD  Cold treatment (icing) relieves pain and reduces inflammation. Cold treatment should be applied for 10 to 15 minutes every 2 to 3 hours for inflammation and pain and immediately after any activity that aggravates your symptoms. Use ice packs or massage the area with a piece of ice (ice massage).  Heat treatment may be used prior to performing the stretching and strengthening activities prescribed by your caregiver, physical therapist, or athletic trainer. Use a heat pack or soak the injury in warm water. SEEK IMMEDIATE MEDICAL CARE IF:  Treatment seems to offer no benefit, or the condition worsens.  Any medications produce adverse side effects. EXERCISES RANGE OF MOTION (ROM) AND STRETCHING EXERCISES - Plantar Fasciitis (Heel Spur Syndrome) These exercises may help you when beginning to rehabilitate your injury. Your symptoms may resolve with or without further involvement from your physician, physical therapist or athletic trainer. While completing these exercises, remember:   Restoring tissue flexibility helps normal motion to return to the joints. This allows healthier, less painful movement and activity.  An effective stretch should be held for at least 30 seconds.  A stretch should never be painful. You should only feel a gentle lengthening or release in the stretched tissue. RANGE OF MOTION - Toe Extension, Flexion  Sit with your right / left leg crossed over your opposite knee.  Grasp your toes and gently pull them back toward the top of your foot. You should feel a stretch on the bottom of your toes and/or foot.  Hold this stretch for __________ seconds.  Now, gently pull your toes toward the bottom of your foot. You should feel a stretch on the top of your toes and or foot.  Hold this stretch for __________ seconds. Repeat __________ times. Complete this stretch __________ times per day.  RANGE OF MOTION - Ankle  Dorsiflexion, Active Assisted  Remove shoes and sit on a chair that is preferably not on a carpeted surface.  Place right / left foot under knee. Extend your opposite leg for support.  Keeping your heel down, slide your right / left foot back toward the chair until you feel a stretch at your ankle or calf. If you do not feel a stretch, slide your bottom forward to the edge of the chair, while still keeping your heel down.  Hold this stretch for __________ seconds. Repeat __________ times. Complete this stretch __________ times per day.  STRETCH - Gastroc, Standing  Place hands on wall.  Extend right / left leg, keeping the front knee somewhat bent.  Slightly point your toes inward on your back foot.  Keeping your right / left heel on the floor and your knee straight, shift your weight toward the wall, not allowing your back to arch.  You should feel a gentle stretch in the right / left calf. Hold this position for __________ seconds. Repeat __________ times. Complete this stretch __________ times per day. STRETCH - [place]us, Standing  Place hands on wall.  Extend right / left leg, keeping the other knee somewhat bent.  Slightly point your toes inward on your back foot.  Keep your right / left heel on the floor, bend your  back knee, and slightly shift your weight over the back leg so that you feel a gentle stretch deep in your back calf.  Hold this position for __________ seconds. Repeat __________ times. Complete this stretch __________ times per day. STRETCH - Gastrocsoleus, Standing  Note: This exercise can place a lot of stress on your foot and ankle. Please complete this exercise only if specifically instructed by your caregiver.   Place the ball of your right / left foot on a step, keeping your other foot firmly on the same step.  Hold on to the wall or a rail for balance.  Slowly lift your other foot, allowing your body weight to press your heel down over the edge of the  step.  You should feel a stretch in your right / left calf.  Hold this position for __________ seconds.  Repeat this exercise with a slight bend in your right / left knee. Repeat __________ times. Complete this stretch __________ times per day.  STRENGTHENING EXERCISES - Plantar Fasciitis (Heel Spur Syndrome)  These exercises may help you when beginning to rehabilitate your injury. They may resolve your symptoms with or without further involvement from your physician, physical therapist or athletic trainer. While completing these exercises, remember:   Muscles can gain both the endurance and the strength needed for everyday activities through controlled exercises.  Complete these exercises as instructed by your physician, physical therapist or athletic trainer. Progress the resistance and repetitions only as guided. STRENGTH - Towel Curls  Sit in a chair positioned on a non-carpeted surface.  Place your foot on a towel, keeping your heel on the floor.  Pull the towel toward your heel by only curling your toes. Keep your heel on the floor.  If instructed by your physician, physical therapist or athletic trainer, add ____________________ at the end of the towel. Repeat __________ times. Complete this exercise __________ times per day. STRENGTH - Ankle Inversion  Secure one end of a rubber exercise band/tubing to a fixed object (table, pole). Loop the other end around your foot just before your toes.  Place your fists between your knees. This will focus your strengthening at your ankle.  Slowly, pull your big toe up and in, making sure the band/tubing is positioned to resist the entire motion.  Hold this position for __________ seconds.  Have your muscles resist the band/tubing as it slowly pulls your foot back to the starting position. Repeat __________ times. Complete this exercises __________ times per day.  Document Released: 07/25/2005 Document Revised: 10/17/2011 Document  Reviewed: 11/06/2008 [facility] Patient Information 2015 [place], [place]. This information is not intended to replace advice given to you by your health care provider. Make sure you discuss any questions you have with your health care provider.

## 2014-08-05 NOTE — Progress Notes (Signed)
Assessment and Plan:  Hypertension: Continue medication, monitor blood pressure at home. Continue DASH diet.  Reminder to go to the ER if any CP, SOB, nausea, dizziness, severe HA, changes vision/speech, left arm numbness and tingling, and jaw pain. Cholesterol: Continue diet and exercise. Check cholesterol.  Pre-diabetes-Continue diet and exercise. Check A1C Vitamin D Def- check level and continue medications.  Obesity with co morbidities- long discussion about weight loss, diet, and exercise Gout- recheck Uric acid as needed, Diet discussed, continue medications. Hypogonadism- continue replacement therapy, check testosterone levels as needed.  ED- will give samples and prescription of viagra Plantar Faciitis-  Conservative treatment, night time orthotics, arch support, RICE, NSAID, stretches given If not better will do injection of dexamethasone Tinea pedis- bilateral feet, cream sent in  Continue diet and meds as discussed. Further disposition pending results of labs.  HPI 53 y.o. male  presents for 3 month follow up with hypertension, hyperlipidemia, prediabetes and vitamin D. His blood pressure has been controlled at home, today their BP is BP: 120/82 mmHg He does not workout. He denies chest pain, shortness of breath, dizziness.  He is on cholesterol medication, pravastatin 40mg  and denies myalgias. His cholesterol is not at goal. The cholesterol last visit was:   Lab Results  Component Value Date   CHOL 210* 01/23/2014   HDL 61 01/23/2014   LDLCALC 128* 01/23/2014   TRIG 103 01/23/2014   CHOLHDL 3.4 01/23/2014   He has been working on diet and exercise for prediabetes, and denies paresthesia of the feet, polydipsia, polyuria and visual disturbances. Last A1C in the office was:  Lab Results  Component Value Date   HGBA1C 5.4 01/23/2014   Patient is on Vitamin D supplement.   Lab Results  Component Value Date   VD25OH 36 01/23/2014     BMI is Body mass index is 35.73  kg/(m^2)., he is working on diet and exercise. Wt Readings from Last 3 Encounters:  08/05/14 249 lb (112.946 kg)  03/17/14 257 lb (116.574 kg)  01/23/14 266 lb (120.657 kg)   Patient is on allopurinol for gout, 1//2 pill daily and states that he has been having right heel pain, unknown if this is plantar fasciitis or gout.  last uric acid was 8.3.  He has a history of testosterone deficiency and is on testosterone replacement. He states that the testosterone helps with his energy, libido, muscle mass. Lab Results  Component Value Date   TESTOSTERONE 341 01/23/2014  ED- given viagra samples last time which helped.    Current Medications:  Current Outpatient Prescriptions on File Prior to Visit  Medication Sig Dispense Refill  . allopurinol (ZYLOPRIM) 300 MG tablet Take 1 tablet (300 mg total) by mouth daily. 90 tablet 0  . B-D 3CC LUER-LOK SYR 22GX1" 22G X 1" 3 ML MISC USE AS DIRECTED 100 each 3  . bisoprolol (ZEBETA) 5 MG tablet TAKE 1 TABLET EVERY DAY 90 tablet 0  . cholecalciferol (VITAMIN D) 1000 UNITS tablet Take 1,000 Units by mouth daily. 5,000iu daily    . colchicine 0.6 MG tablet TAKE 1 TABLET BY MOUTH EVERY DAY 30 tablet 2  . hydrochlorothiazide (HYDRODIURIL) 25 MG tablet TAKE 1 TABLET EVERY DAY 90 tablet 1  . losartan (COZAAR) 50 MG tablet Take 1 tablet (50 mg total) by mouth daily. 90 tablet 1  . Magnesium 250 MG TABS Take by mouth.    . pantoprazole (PROTONIX) 40 MG tablet TAKE 1 TABLET EVERY DAY 30 mins before food 90  tablet 1  . pravastatin (PRAVACHOL) 40 MG tablet Take 1 tablet (40 mg total) by mouth daily. 90 tablet 0  . testosterone cypionate (DEPOTESTOTERONE CYPIONATE) 200 MG/ML injection INJECT 2ML IM EVERY 2 WEEKS 10 mL 0   No current facility-administered medications on file prior to visit.   Medical History:  Past Medical History  Diagnosis Date  . Hypertension   . Hyperlipidemia   . Other testicular hypofunction   . OSA (obstructive sleep apnea)   .  Vitamin D deficiency disease   . Pre-diabetes   . Hypogonadism male    Allergies: No Known Allergies   Review of Systems:  Review of Systems  Constitutional: Negative.   HENT: Negative.   Eyes: Negative.   Respiratory: Negative.   Cardiovascular: Negative.   Gastrointestinal: Negative.   Genitourinary: Negative.        + ED  Musculoskeletal: Positive for myalgias. Negative for back pain, joint pain, falls and neck pain.       Right foot pain  Skin: Negative.   Neurological: Negative.   Psychiatric/Behavioral: Negative.     Family history- Review and unchanged Social history- Review and unchanged Physical Exam: BP 120/82 mmHg  Pulse 60  Temp(Src) 97.9 F (36.6 C)  Resp 16  Ht 5\' 10"  (1.778 m)  Wt 249 lb (112.946 kg)  BMI 35.73 kg/m2 Wt Readings from Last 3 Encounters:  08/05/14 249 lb (112.946 kg)  03/17/14 257 lb (116.574 kg)  01/23/14 266 lb (120.657 kg)   General Appearance: Well nourished, in no apparent distress. Eyes: PERRLA, EOMs, conjunctiva no swelling or erythema Sinuses: No Frontal/maxillary tenderness ENT/Mouth: Ext aud canals clear, TMs without erythema, bulging. No erythema, swelling, or exudate on post pharynx.  Tonsils not swollen or erythematous. Hearing normal. Crowded mouth.  Neck: Supple, thyroid normal.  Respiratory: Respiratory effort normal, BS equal bilaterally without rales, rhonchi, wheezing or stridor.  Cardio: RRR with no MRGs. Brisk peripheral pulses without edema.  Abdomen: Soft, + BS.  Non tender, no guarding, rebound, hernias, masses. Lymphatics: Non tender without lymphadenopathy.  Musculoskeletal: Full ROM, 5/5 strength, normal gait. Good pulses, good sensation bilaterally. Right heel pain, worse with dorsiflexion Skin: Warm, dry without rashes, lesions, ecchymosis. + tinea pedis bilateral feet Neuro: Cranial nerves intact. Normal muscle tone, no cerebellar symptoms. Sensation intact.  Psych: Awake and oriented X 3, normal affect,  Insight and Judgment appropriate.    Quentin Mullingollier, Lujean Ebright, PA-C 11:43 AM [facility]

## 2014-08-06 LAB — TSH: TSH: 0.938 u[IU]/mL (ref 0.350–4.500)

## 2014-08-06 LAB — VITAMIN D 25 HYDROXY (VIT D DEFICIENCY, FRACTURES): VIT D 25 HYDROXY: 35 ng/mL (ref 30–100)

## 2014-08-07 MED ORDER — PREDNISONE 20 MG PO TABS: ORAL_TABLET | ORAL | Status: DC

## 2014-08-07 NOTE — Addendum Note (Signed)
Addended by: Quentin MullingOLLIER, AMANDA R on: 08/07/2014 08:12 AM   Modules accepted: Orders

## 2014-08-11 ENCOUNTER — Other Ambulatory Visit: Payer: Self-pay | Admitting: Physician Assistant

## 2014-08-11 ENCOUNTER — Other Ambulatory Visit: Payer: Self-pay

## 2014-08-11 DIAGNOSIS — M25572 Pain in left ankle and joints of left foot: Secondary | ICD-10-CM

## 2014-08-11 MED ORDER — ALLOPURINOL 300 MG PO TABS: 300.0000 mg | ORAL_TABLET | Freq: Every day | ORAL | Status: DC

## 2014-09-10 ENCOUNTER — Ambulatory Visit: Payer: Self-pay

## 2014-09-11 ENCOUNTER — Encounter: Attending: Cardiovascular Disease | Primary: Family Medicine

## 2014-09-15 ENCOUNTER — Encounter: Payer: Self-pay | Admitting: Internal Medicine

## 2014-09-15 ENCOUNTER — Ambulatory Visit (INDEPENDENT_AMBULATORY_CARE_PROVIDER_SITE_OTHER): Payer: BLUE CROSS/BLUE SHIELD | Admitting: Internal Medicine

## 2014-09-15 VITALS — BP 140/88 | HR 76 | Temp 99.0°F | Resp 16 | Ht 70.0 in | Wt 251.4 lb

## 2014-09-15 DIAGNOSIS — I10 Essential (primary) hypertension: Secondary | ICD-10-CM

## 2014-09-15 NOTE — Patient Instructions (Signed)
  Restart ZeBeta (Bisoprolol) for BP  & continue Losartin 50 mg

## 2014-09-15 NOTE — Progress Notes (Signed)
   Subjective:    Patient ID: [name], male    DOB: 1961-06-11, 54 y.o.   MRN: [id number]  HPI Patient is a very 54 yo BM with hx/o HTN who was in recently and because of c/o ED was advised to d/c his HCTZ & Bisoprolol . He relates that his ED has remained unchanged, but seems consigned not to pursue it further at this time. He is on Testosterone Replacement and has used Viagra in the past. 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 He reports home BP's range  120-140 /70-90. He denies HT sx's of HA's, dizziness, CP, palpitations or edema.   Medication Sig  . allopurinol (ZYLOPRIM) 300 MG tablet Take 1 tablet (300 mg total) by mouth daily.  Marland Kitchen. VITAMIN D  5,000 iu daily  . colchicine 0.6 MG tablet TAKE 1 TABLET BY MOUTH EVERY DAY  . losartan 50 MG tablet Take 1 tablet (50 mg total) by mouth daily.  . Magnesium 250 MG TABS Take by mouth.  . nystatin cream (MYCOSTATIN) Apply 1 application topically 2 (two) times daily. To feet bilateral  . pantoprazole  40 MG tablet TAKE 1 TABLET EVERY DAY 30 MINS BEFORE FOOD  . pravastatin  40 MG tablet Take 1 tablet (40 mg total) by mouth daily.  . DEPOTESTOTERONE  200 MG/ML injection INJECT 2ML IM EVERY 2 WEEKS  . sildenafil (VIAGRA) 100 MG tablet 1/2-1 as needed for an erection   No Known Allergies   Past Medical History  Diagnosis Date  . Hypertension   . Hyperlipidemia   . Other testicular hypofunction   . OSA (obstructive sleep apnea)   . Vitamin D deficiency disease   . Pre-diabetes   . Hypogonadism male    Review of Systems  10 pt system review negative to above.      Objective:   Physical Exam BP 140/88 & rechecked at 140/92   76  T 99 F  R 16  Ht 5\' 10"    Wt 251 lb 6.4 oz     BMI 36.07  HEENT - Eac's patent. TM's Nl. EOM's full. PERRLA. NasoOroPharynx clear. Neck - supple. Nl Thyroid. Carotids 2+ & No bruits, nodes, JVD Chest - Clear equal BS w/o Rales, rhonchi, wheezes. Cor - Nl HS.  RRR w/o sig MGR. PP 1(+). No edema. Abd - No palpable organomegaly, masses or tenderness. BS nl. MS- FROM w/o deformities. Muscle power, tone and bulk Nl. Gait Nl. Neuro - No obvious Cr N abnormalities. Sensory, motor and Cerebellar functions appear Nl w/o focal abnormalities. Psyche - Mental status normal & appropriate.  No delusions, ideations or obvious mood abnormalities.     Assessment & Plan:   1. Essential hypertension  - Recommended restart Bisoprolol and monitor BP's and call if elevated.

## 2014-09-17 ENCOUNTER — Other Ambulatory Visit: Payer: Self-pay | Admitting: *Deleted

## 2014-09-17 MED ORDER — HYDROCHLOROTHIAZIDE 25 MG PO TABS: 25.0000 mg | ORAL_TABLET | Freq: Every day | ORAL | Status: DC

## 2014-09-29 ENCOUNTER — Ambulatory Visit
Admit: 2014-09-29 | Discharge: 2014-09-29 | Payer: PRIVATE HEALTH INSURANCE | Attending: Cardiovascular Disease | Primary: Family Medicine

## 2014-09-29 DIAGNOSIS — I472 Ventricular tachycardia, unspecified: Secondary | ICD-10-CM

## 2014-09-29 NOTE — Progress Notes (Signed)
[name]  MD  [facility]                                  Dipl[facility], N[facility], CBC[facility], R[facility]                                     OFFICE  FOLLOWUP NOTE    Subjective:        CC: 6 month FU    See below for additional complains    HPI:    Carl Castro is a 54 y.o.year old who  has a past medical history of Hyperlipidemia; Tachycardia; Palpitations; History of complete ECG; Frequent PVCs; H/O cardiovascular stress test; Family history of early CAD; Atypical chest pain; H/O echocardiogram; S/P cardiac cath; H/O 24 hour EKG monitoring; and S/P radiofrequency ablation operation for arrhythmia. and presents with had concerns including Hyperlipidemia.  Chief Complaint   Patient presents with   ??? Hyperlipidemia     pt is here for his 1 year OV.Pt denies any CP,SOB,edema,plap,dizziness      Filed Vitals:    09/29/14 1546   BP: 122/82   Pulse: 80       Current Outpatient Prescriptions   Medication Sig Dispense Refill   ??? metronidazole (METROGEL) 0.75 % gel Apply  topically daily. Apply topically 2 times daily.     ??? aspirin 81 MG tablet Take 81 mg by mouth daily.     ??? rosuvastatin (CRESTOR) 5 MG tablet Take 5 mg by mouth daily.       No current facility-administered medications for this visit.     Allergies: Review of patient's allergies indicates no known allergies.  Past Medical History   Diagnosis Date   ??? Hyperlipidemia    ??? Tachycardia    ??? Palpitations    ??? History of complete ECG 09/08/2011   ??? Frequent PVCs    ??? H/O cardiovascular stress test 09/15/2011     09/15/2011-Normal study. No ischemia. LVSF normal. EF 63%.    ??? Family history of early CAD    ??? Atypical chest pain    ??? H/O echocardiogram 09/15/2011     09/15/2011- LVSF normal. EF 55%.Impaired LV relaxation. Mild TR.-report in epic   ??? S/P cardiac cath 11/11/2011     normal coronaries. reffer for EP study   ??? H/O 24 hour EKG monitoring 11/03/11     48 hour- the predominant rhythm is sinus rhythm with one 10-beat run of nonsustained ventricular  tachycardia, the patient is already being referred for EP study to Dr. Dianah Field in [place]   ??? S/P radiofrequency ablation operation for arrhythmia      History reviewed. No pertinent past surgical history.  Family History   Problem Relation Age of Onset   ??? Heart Disease Father      CMP A-fib     History   Substance Use Topics   ??? Smoking status: Former Smoker   ??? Smokeless tobacco: Former Neurosurgeon   ??? Alcohol Use: 2.4 oz/week     4 Cans of beer per week        Review of Systems:   ?? Constitutional: No Fever or Weight Loss   ?? Eyes: No Decreased Vision  ?? ENT: No Headaches, Hearing Loss or Vertigo  ?? Cardiovascular: No chest pain, dyspnea on exertion, palpitations or loss of consciousness  ??  Respiratory: No cough or wheezing    ?? Gastrointestinal: No abdominal pain, appetite loss, blood in stools, constipation, diarrhea or heartburn  ?? Genitourinary: No dysuria, trouble voiding, or hematuria  ?? Musculoskeletal:  none  ?? Integumentary: No rash or pruritis  ?? Neurological: No TIA or stroke symptoms  ?? Psychiatric: No anxiety or depression  ?? Endocrine: No malaise, fatigue or temperature intolerance  ?? Hematologic/Lymphatic: No bleeding problems, blood clots or swollen lymph nodes  ?? Allergic/Immunologic: No nasal congestion or hives    Objective:      Physical Exam:  BP 122/82 mmHg   Pulse 80   Ht 6\' 3"  (1.905 m)   Wt 255 lb (115.667 kg)   BMI 31.87 kg/m2  Wt Readings from Last 3 Encounters:   09/29/14 255 lb (115.667 kg)   12/25/13 258 lb (117.028 kg)   09/11/13 251 lb (113.853 kg)     Body mass index is 31.87 kg/(m^2).  GENERAL - Alert, oriented, pleasant, in no apparent distress.    Head unremarkable  Eyes unremarkable  ENT - Unremarkable.  Neck - Supple.  No jugular venous distention noted. No carotid bruits.   Cardiovascular - Normal S1 and S2 without obvious murmur or gallop.    Extremities - No cyanosis, clubbing, or significant edema.    Pulmonary - No respiratory distress.  No wheezes or rales.    Pulses:  Bilateral radial and pedal pulses normal  Abdomen - No masses, tenderness, or organomegaly.  Musculoskeletal - normal   Neurologic -   There are  no gross focal neurologic abnormalities.  Skin- normal  Affect; normal    DATA:    No results found for: CKTOTAL  BNP:    No results found for: BNP  PT/INR:    No components found for: PTPATIENT,  PTINR  No results found for: LABA1C  Lab Results   Component Value Date    CHOL 186 11/26/2013     No results found for: ALT  TSH:    No results found for: TSH    EKG  Labs, tests, notes reviewed    QUALITY MEASURES:  CAD:  No   CHOL LOWERING:  No- if No Why  ANTIPLATELET:  No - if No why  BETA BLOCKER    no  IF  NO WHY    Impression:            Patient Active Problem List   Diagnosis Code   ??? Hyperlipidemia E78.5   ??? Tachycardia R00.0   ??? V tach I47.2   ??? Swelling R60.9   ??? H/O 24 hour EKG monitoring Z92.89   ??? Palpitations R00.2   ??? Frequent PVCs I49.3     @[email or web address] @  Problem List Items Addressed This Visit     None          PLAN:     -  LIPID MANAGEMENT:  12. Diet advised,  13. Meds reviewed,Lipid profile reviewed with Pt  14. Changes:No  15. Test ordered today; No                    Lab Results   Component Value Date    CHOL 186 11/26/2013                           [facility] found [facility]: [facility]     - benign vt as per EPS  FU in 8months

## 2014-12-22 ENCOUNTER — Encounter: Payer: Self-pay | Admitting: Physician Assistant

## 2015-01-11 ENCOUNTER — Other Ambulatory Visit: Payer: Self-pay | Admitting: Internal Medicine

## 2015-01-27 ENCOUNTER — Encounter: Payer: Self-pay | Admitting: Physician Assistant

## 2015-02-16 ENCOUNTER — Encounter: Payer: Self-pay | Admitting: Physician Assistant

## 2015-02-16 ENCOUNTER — Ambulatory Visit (INDEPENDENT_AMBULATORY_CARE_PROVIDER_SITE_OTHER): Payer: BLUE CROSS/BLUE SHIELD | Admitting: Physician Assistant

## 2015-02-16 ENCOUNTER — Other Ambulatory Visit: Payer: Self-pay

## 2015-02-16 VITALS — BP 122/78 | HR 72 | Temp 97.7°F | Resp 16 | Ht 70.0 in | Wt 248.0 lb

## 2015-02-16 DIAGNOSIS — Z1159 Encounter for screening for other viral diseases: Secondary | ICD-10-CM

## 2015-02-16 DIAGNOSIS — R7303 Prediabetes: Secondary | ICD-10-CM

## 2015-02-16 DIAGNOSIS — E559 Vitamin D deficiency, unspecified: Secondary | ICD-10-CM

## 2015-02-16 DIAGNOSIS — M109 Gout, unspecified: Secondary | ICD-10-CM

## 2015-02-16 DIAGNOSIS — E782 Mixed hyperlipidemia: Secondary | ICD-10-CM

## 2015-02-16 DIAGNOSIS — Z9119 Patient's noncompliance with other medical treatment and regimen: Secondary | ICD-10-CM | POA: Insufficient documentation

## 2015-02-16 DIAGNOSIS — Z0001 Encounter for general adult medical examination with abnormal findings: Secondary | ICD-10-CM

## 2015-02-16 DIAGNOSIS — E291 Testicular hypofunction: Secondary | ICD-10-CM

## 2015-02-16 DIAGNOSIS — Z91199 Patient's noncompliance with other medical treatment and regimen due to unspecified reason: Secondary | ICD-10-CM | POA: Insufficient documentation

## 2015-02-16 DIAGNOSIS — I10 Essential (primary) hypertension: Secondary | ICD-10-CM

## 2015-02-16 DIAGNOSIS — E669 Obesity, unspecified: Secondary | ICD-10-CM

## 2015-02-16 DIAGNOSIS — R6889 Other general symptoms and signs: Secondary | ICD-10-CM

## 2015-02-16 DIAGNOSIS — Z79899 Other long term (current) drug therapy: Secondary | ICD-10-CM | POA: Insufficient documentation

## 2015-02-16 DIAGNOSIS — Z125 Encounter for screening for malignant neoplasm of prostate: Secondary | ICD-10-CM

## 2015-02-16 DIAGNOSIS — G4733 Obstructive sleep apnea (adult) (pediatric): Secondary | ICD-10-CM

## 2015-02-16 DIAGNOSIS — N529 Male erectile dysfunction, unspecified: Secondary | ICD-10-CM

## 2015-02-16 LAB — CBC WITH DIFFERENTIAL/PLATELET
Basophils Absolute: 0 10*3/uL (ref 0.0–0.1)
Basophils Relative: 0 % (ref 0–1)
Eosinophils Absolute: 0.2 10*3/uL (ref 0.0–0.7)
Eosinophils Relative: 2 % (ref 0–5)
HCT: 43 % (ref 39.0–52.0)
Hemoglobin: 15 g/dL (ref 13.0–17.0)
LYMPHS ABS: 2.9 10*3/uL (ref 0.7–4.0)
Lymphocytes Relative: 31 % (ref 12–46)
MCH: 31.3 pg (ref 26.0–34.0)
MCHC: 34.9 g/dL (ref 30.0–36.0)
MCV: 89.6 fL (ref 78.0–100.0)
MONOS PCT: 8 % (ref 3–12)
MPV: 9.3 fL (ref 8.6–12.4)
Monocytes Absolute: 0.7 10*3/uL (ref 0.1–1.0)
Neutro Abs: 5.4 10*3/uL (ref 1.7–7.7)
Neutrophils Relative %: 59 % (ref 43–77)
Platelets: 288 10*3/uL (ref 150–400)
RBC: 4.8 MIL/uL (ref 4.22–5.81)
RDW: 13.7 % (ref 11.5–15.5)
WBC: 9.2 10*3/uL (ref 4.0–10.5)

## 2015-02-16 MED ORDER — PHENTERMINE HCL 37.5 MG PO TABS: 37.5000 mg | ORAL_TABLET | Freq: Every day | ORAL | Status: DC

## 2015-02-16 MED ORDER — LOSARTAN POTASSIUM 50 MG PO TABS: 50.0000 mg | ORAL_TABLET | Freq: Every day | ORAL | Status: DC

## 2015-02-16 MED ORDER — TESTOSTERONE CYPIONATE 200 MG/ML IM SOLN: INTRAMUSCULAR | Status: DC

## 2015-02-16 MED ORDER — SILDENAFIL CITRATE 100 MG PO TABS: 100.0000 mg | ORAL_TABLET | ORAL | Status: DC | PRN

## 2015-02-16 NOTE — Patient Instructions (Addendum)
Call Dr. Loreta Ave about colonoscopy  Phone: [phone];   Use a dropper or use a cap to put olive oil,mineral oil or canola oil in the effected ear- 2-3 times a week. Let it soak for 20-30 min then you can take a shower or use a baby bulb with warm water to wash out the ear wax.  Do not use Qtips  Can call Dr. Toni Arthurs to get evaluated for dental sleep appliance. # [phone]  OR you can try Dr. Reatha Armour in [facility] # [phone]  We will send notes. Call and get price quote on both.   I think it is possible that you have sleep apnea. It can cause interrupted sleep, headaches, frequent awakenings, fatigue, dry mouth, fast/slow heart beats, memory issues, anxiety/depression, swelling, numbness tingling hands/feet, weight gain, shortness of breath, and the list goes on. Sleep apnea needs to be ruled out because if it is left untreated it does eventually lead to abnormal heart beats, lung failure or heart failure as well as increasing the risk of heart attack and stroke. There are masks you can wear OR a mouth piece that I can give you information about. Often times though people feel MUCH better after getting treatment.   Sleep Apnea  Sleep apnea is a sleep disorder characterized by abnormal pauses in breathing while you sleep. When your breathing pauses, the level of oxygen in your blood decreases. This causes you to move out of deep sleep and into light sleep. As a result, your quality of sleep is poor, and the system that carries your blood throughout your body (cardiovascular system) experiences stress. If sleep apnea remains untreated, the following conditions can develop:  High blood pressure (hypertension).  Coronary artery disease.  Inability to achieve or maintain an erection (impotence).  Impairment of your thought process (cognitive dysfunction). There are three types of sleep apnea: 1. Obstructive sleep apnea--Pauses in breathing during sleep because of a blocked  airway. 2. Central sleep apnea--Pauses in breathing during sleep because the area of the brain that controls your breathing does not send the correct signals to the muscles that control breathing. 3. Mixed sleep apnea--A combination of both obstructive and central sleep apnea.  RISK FACTORS The following risk factors can increase your risk of developing sleep apnea:  Being overweight.  Smoking.  Having narrow passages in your nose and throat.  Being of older age.  Being male.  Alcohol use.  Sedative and tranquilizer use.  Ethnicity. Among individuals younger than 35 years, African Americans are at increased risk of sleep apnea. SYMPTOMS   Difficulty staying asleep.  Daytime sleepiness and fatigue.  Loss of energy.  Irritability.  Loud, heavy snoring.  Morning headaches.  Trouble concentrating.  Forgetfulness.  Decreased interest in sex. DIAGNOSIS  In order to diagnose sleep apnea, your caregiver will perform a physical examination. Your caregiver may suggest that you take a home sleep test. Your caregiver may also recommend that you spend the night in a sleep lab. In the sleep lab, several monitors record information about your heart, lungs, and brain while you sleep. Your leg and arm movements and blood oxygen level are also recorded. TREATMENT The following actions may help to resolve mild sleep apnea:  Sleeping on your side.   Using a decongestant if you have nasal congestion.   Avoiding the use of depressants, including alcohol, sedatives, and narcotics.   Losing weight and modifying your diet if you are overweight. There also are devices  and treatments to help open your airway:  Oral appliances. These are custom-made mouthpieces that shift your lower jaw forward and slightly open your bite. This opens your airway.  Devices that create positive airway pressure. This positive pressure "splints" your airway open to help you breathe better during sleep. The  following devices create positive airway pressure:  Continuous positive airway pressure (CPAP) device. The CPAP device creates a continuous level of air pressure with an air pump. The air is delivered to your airway through a mask while you sleep. This continuous pressure keeps your airway open.  Nasal expiratory positive airway pressure (EPAP) device. The EPAP device creates positive air pressure as you exhale. The device consists of single-use valves, which are inserted into each nostril and held in place by adhesive. The valves create very little resistance when you inhale but create much more resistance when you exhale. That increased resistance creates the positive airway pressure. This positive pressure while you exhale keeps your airway open, making it easier to breath when you inhale again.  Bilevel positive airway pressure (BPAP) device. The BPAP device is used mainly in patients with central sleep apnea. This device is similar to the CPAP device because it also uses an air pump to deliver continuous air pressure through a mask. However, with the BPAP machine, the pressure is set at two different levels. The pressure when you exhale is lower than the pressure when you inhale.  Surgery. Typically, surgery is only done if you cannot comply with less invasive treatments or if the less invasive treatments do not improve your condition. Surgery involves removing excess tissue in your airway to create a wider passage way. Document Released: 07/15/2002 Document Revised: 11/19/2012 Document Reviewed: 12/01/2011 [facility] Patient Information 2015 [place]. This information is not intended to replace advice given to you by your health care provider. Make sure you discuss any questions you have with your health care provider.  Phentermine  While taking the medication we may ask that you come into the office once a month or once every 2-3 months to monitor your weight, blood pressure, and heart rate.  In addition we can help answer your questions about diet, exercise, and help you every step of the way with your weight loss journey. Sometime it is helpful if you bring in a food diary or use an app on your phone such as myfitnesspal to record your calorie intake, especially in the beginning.   You can start out on 1/3 to 1/2 a pill in the morning and if you are tolerating it well you can increase to one pill daily. I also have some patients that take 1/3 or 1/2 at lunch to help prevent night time eating.  This medication [facility] [facility] is 14-17 dollars and you do NOT need a membership to get meds from there.    What is this medicine? PHENTERMINE (FEN ter meen) decreases your appetite. This medicine is intended to be used in addition to a healthy reduced calorie diet and exercise. The best results are achieved this way. This medicine is only indicated for short-term use. Eventually your weight loss may level out and the medication will no longer be needed.   How should I use this medicine? Take this medicine by mouth. Follow the directions on the prescription label. The tablets should stay in the bottle until immediately before you take your dose. Take your doses at regular intervals. Do not take your  medicine more often than directed.  Overdosage: If you think you have taken too much of this medicine contact a poison control center or emergency room at once. NOTE: This medicine is only for you. Do not share this medicine with others.  What if I miss a dose? If you miss a dose, take it as soon as you can. If it is almost time for your next dose, take only that dose. Do not take double or extra doses. Do not increase or in any way change your dose without consulting your doctor.  What should I watch for while using this medicine? Notify your physician immediately if you become short of breath while doing your normal activities. Do not take this medicine  within 6 hours of bedtime. It can keep you from getting to sleep. Avoid drinks that contain caffeine and try to stick to a regular bedtime every night. Do not stand or sit up quickly, especially if you are an older patient. This reduces the risk of dizzy or fainting spells. Avoid alcoholic drinks.  What side effects may I notice from receiving this medicine? Side effects that you should report to your doctor or health care professional as soon as possible: -chest pain, palpitations -depression or severe changes in mood -increased blood pressure -irritability -nervousness or restlessness -severe dizziness -shortness of breath -problems urinating -unusual swelling of the legs -vomiting  Side effects that usually do not require medical attention (report to your doctor or health care professional if they continue or are bothersome): -blurred vision or other eye problems -changes in sexual ability or desire -constipation or diarrhea -difficulty sleeping -dry mouth or unpleasant taste -headache -nausea This list may not describe all possible side effects. Call your doctor for medical advice about side effects. You may report side effects to FDA at 1-800-FDA-1088.   GETTING OFF OF PPI's    Nexium/protonix/prilosec/Omeprazole/Dexilant/Aciphex are called PPI's, they are great at healing your stomach but should only be taken for a short period of time.     Recent studies have shown that taken for a long time they  can increase the risk of osteoporosis (weakening of your bones), pneumonia, low magnesium, restless legs, Cdiff (infection that causes diarrhea), DEMENTIA and most recently kidney damage / disease / insufficiency.     Due to this information we want to try to stop the PPI but if you try to stop it abruptly this can cause rebound acid and worsening symptoms.   So this is how we want you to get off the PPI:  - Start taking the nexium/protonix/prilosec/PPI  every other day with   zantac (ranitidine) 2 x a day for 2-4 weeks  - then decrease the PPI to every 3 days while taking the zantac (ranitidine) twice a day the other  days for 2-4  Weeks  - then you can try the zantac (ranitidine) once at night or up to 2 x day as needed.  - you can continue on this once at night or stop all together  - Avoid alcohol, spicy foods, NSAIDS (aleve, ibuprofen) at this time. See foods below.   +++++++++++++++++++++++++++++++++++++++++++  Food Choices for Gastroesophageal Reflux Disease  When you have gastroesophageal reflux disease (GERD), the foods you eat and your eating habits are very important. Choosing the right foods can help ease the discomfort of GERD. WHAT GENERAL GUIDELINES DO I NEED TO FOLLOW?  Choose fruits, vegetables, whole grains, low-fat dairy products, and low-fat meat, fish, and poultry.  Limit fats such as  oils, salad dressings, butter, nuts, and avocado.  Keep a food diary to identify foods that cause symptoms.  Avoid foods that cause reflux. These may be different for different people.  Eat frequent small meals instead of three large meals each day.  Eat your meals slowly, in a relaxed setting.  Limit fried foods.  Cook foods using methods other than frying.  Avoid drinking alcohol.  Avoid drinking large amounts of liquids with your meals.  Avoid bending over or lying down until 2-3 hours after eating.   WHAT FOODS ARE NOT RECOMMENDED? The following are some foods and drinks that may worsen your symptoms:  Vegetables Tomatoes. Tomato juice. Tomato and spaghetti sauce. Chili peppers. Onion and garlic. Horseradish. Fruits Oranges, grapefruit, and lemon (fruit and juice). Meats High-fat meats, fish, and poultry. This includes hot dogs, ribs, ham, sausage, salami, and bacon. Dairy Whole milk and chocolate milk. Sour cream. Cream. Butter. Ice cream. Cream cheese.  Beverages Coffee and tea, with or without caffeine. Carbonated beverages or  energy drinks. Condiments Hot sauce. Barbecue sauce.  Sweets/Desserts Chocolate and cocoa. Donuts. Peppermint and spearmint. Fats and Oils High-fat foods, including [place] fries and potato chips. Other Vinegar. Strong spices, such as black pepper, white pepper, red pepper, cayenne, curry powder, cloves, ginger, and chili powder. Nexium/protonix/prilosec are called PPI's, they are great at healing your stomach but should only be taken for a short period of time.   Varicose Veins Varicose veins are veins that have become enlarged and twisted. CAUSES This condition is the result of valves in the veins not working properly. Valves in the veins help return blood from the leg to the heart. When your calf muscles squeeze, the blood moves up your leg then the valves close and this continues until the blood gets back to your heart.  If these valves are damaged, blood flows backwards and backs up into the veins in the leg near the skin OR if your are sitting/standing for a long time without using your calf muscles the blood will back up into the veins in your legs. This causes the veins to become larger. People who are on their feet a lot, sit a lot without walking (like on a plane, at a desk, or in a car), who are pregnant, or who are overweight are more likely to develop varicose veins. SYMPTOMS   Bulging, twisted-appearing, bluish veins, most commonly found on the legs.  Leg pain or a feeling of heaviness. These symptoms may be worse at the end of the day.  Leg swelling.  Skin color changes. DIAGNOSIS  Varicose veins can usually be diagnosed with an exam of your legs by your caregiver. He or she may recommend an ultrasound of your leg veins. TREATMENT  Most varicose veins can be treated at home.However, other treatments are available for people who have persistent symptoms or who want to treat the cosmetic appearance of the varicose veins. But this is only cosmetic and they will return if not  properly treated. These include:  Laser treatment of very small varicose veins.  Medicine that is shot (injected) into the vein. This medicine hardens the walls of the vein and closes off the vein. This treatment is called sclerotherapy. Afterwards, you may need to wear clothing or bandages that apply pressure.  Surgery. HOME CARE INSTRUCTIONS   Do not stand or sit in one position for long periods of time. Do not sit with your legs crossed. Rest with your legs raised during the day.  Your legs have to be higher than your heart so that gravity will force the valves to open, so please really elevate your legs.   Wear elastic stockings or support hose. Do not wear other tight, encircling garments around the legs, pelvis, or waist.  [employer]  has a wide variety of well priced compression stockings. [address] [place], [place] [address] [phone]  OR THERE ARE COPPER INFUSED COMPRESSION SOCKS AT [facility] OR [facility]  Walk as much as possible to increase blood flow.  Raise the foot of your bed at night with 2-inch blocks.  If you get a cut in the skin over the vein and the vein bleeds, lie down with your leg raised and press on it with a clean cloth until the bleeding stops. Then place a bandage (dressing) on the cut. See your caregiver if it continues to bleed or needs stitches. SEEK MEDICAL CARE IF:   The skin around your ankle starts to break down.  You have pain, redness, tenderness, or hard swelling developing in your leg over a vein.  You are uncomfortable due to leg pain. Document Released: 05/04/2005 Document Revised: 10/17/2011 Document Reviewed: 09/20/2010 [facility] Patient Information 2014 [place], [place].   We want weight loss that will last so you should lose 1-2 pounds a week.  THAT IS IT! Please pick THREE things a month to change. Once it is a habit check off the item. Then pick another three items off the list to become habits.  If you are already doing a habit  on the list GREAT!  Cross that item off! o Don't drink your calories. Ie, alcohol, soda, fruit juice, and sweet tea.  o Drink more water. Drink a glass when you feel hungry or before each meal.  o Eat breakfast - Complex carb and protein (likeDannon light and fit yogurt, oatmeal, fruit, eggs, [place] bacon). o Measure your cereal.  Eat no more than one cup a day. (ie [place]) o Eat an apple a day. o Add a vegetable a day. o Try a new vegetable a month. o Use Pam! Stop using oil or butter to cook. o Don't finish your plate or use smaller plates. o Share your dessert. o Eat sugar free Jello for dessert or frozen grapes. o Don't eat 2-3 hours before bed. o Switch to whole wheat bread, pasta, and brown rice. o Make healthier choices when you eat out. No fries! o Pick baked chicken, NOT fried. o Don't forget to SLOW DOWN when you eat. It is not going anywhere.  o Take the stairs. o Park far away in the parking lot o [employer] (or weights) for 10 minutes while watching TV. o Walk at work for 10 minutes during break. o Walk outside 1 time a week with your friend, kids, dog, or significant other. o Start a walking group at church. o Walk the mall as much as you can tolerate.  o Keep a food diary. o Weigh yourself daily. o Walk for 15 minutes 3 days per week. o Cook at home more often and eat out less.  If life happens and you go back to old habits, it is okay.  Just start over. You can do it!   If you experience chest pain, get short of breath, or tired during the exercise, please stop immediately and inform your doctor.

## 2015-02-16 NOTE — Progress Notes (Signed)
Complete Physical  Assessment and Plan: 1. Essential hypertension - continue medications, DASH diet, exercise and monitor at home. Call if greater than 130/80.  - CBC with Differential/Platelet - BASIC METABOLIC PANEL WITH GFR - Hepatic function panel - TSH - Urinalysis, Routine w reflex microscopic (not at [facility]) - Microalbumin / creatinine urine ratio - EKG 12-Lead - US, RETROPERITNL ABD,  LTD  2. Pre-diabetes Discussed general issues about diabetes pathophysiology and management., Educational material distributed., Suggested low cholesterol diet., Encouraged aerobic exercise., Discussed foot care., Reminded to get yearly retinal exam. - Hemoglobin A1c - Insulin, fasting - LOW EXTREMITY NEUR EXAM DOCUM  3. Mixed hyperlipidemia -continue medications, check lipids, decrease fatty foods, increase activity.  - Lipid panel  4. Obesity Obesity with co morbidities- long discussion about weight loss, diet, and exercise, will start the patient on phentermine- hand out given and AE's discussed, will do close follow up.   5. Hypogonadism male Hypogonadism- continue replacement therapy, check testosterone levels as needed, will NOT check today since had shot yesterday  6. Vitamin D deficiency disease - Vit D  25 hydroxy (rtn osteoporosis monitoring)  7. Medication management - Magnesium  8. Gout without tophus, unspecified cause, unspecified chronicity, unspecified site Get and STAY on allopurinol - Uric acid  9. Erectile dysfunction, unspecified erectile dysfunction type viagra printed out with 50% off coupon  10. OSA (obstructive sleep apnea) Sleep apnea- get on CPAP and given Dr. Merwyn KatosFuller/[place] numbers, weight loss advised.   11. Noncompliance Noncompliance- emphasized compliance with medications and appointments, patient expressed understanding  12. Prostate cancer screening - PSA  13. Screening for viral disease - HIV antibody   Discussed med's effects and SE's.  Screening labs and tests as requested with regular follow-up as recommended.  HPI 54 y.o. AA  male  presents for a complete physical. 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 He denies chest pain, shortness of breath, dizziness.  He is on cholesterol medication, pravastatin 40 and denies myalgias. His cholesterol is at goal. The cholesterol last visit was:  Lab Results  Component Value Date   CHOL 243* 08/05/2014   HDL 53 08/05/2014   LDLCALC 165* 08/05/2014   TRIG 125 08/05/2014   CHOLHDL 4.6 08/05/2014   He has been working on diet and exercise for prediabetes, and denies paresthesia of the feet, polydipsia and polyuria. Last A1C in the office was:  Lab Results  Component Value Date   HGBA1C 5.4 01/23/2014   Patient is on Vitamin D supplement, 5000 IU a day but has not been taking it. Lab Results  Component Value Date   VD25OH 9235 08/05/2014   He is has a history of testosterone deficiency and subsequent ED and is on testosterone replacement, his shots were changed to 2 cc q 3 weeks, yesterday was last injection but he is currently out of his medications. He states that the testosterone helps with his energy, libido, muscle mass. Lab Results  Component Value Date   TESTOSTERONE 341 01/23/2014   Patient is not on allopurinol for gout and does not report a recent flare.  Lab Results  Component Value Date   LABURIC 9.7* 08/05/2014   He has a diagnosis of sleep apnea, last study in 2009 but he has tried multiple attempts to wear a CPAP and difference  masks but is unable to tolerate it.  BMI is Body mass index is 35.58 kg/(m^2)., he is working on diet and exercise. Wt Readings from Last 3 Encounters:  02/16/15 248 lb (112.492 kg)  09/15/14 251 lb 6.4 oz (114.034 kg)  08/05/14 249 lb (112.946 kg)   Lab Results   Component Value Date   PSA 0.80 01/23/2014   Current Medications:  Current Outpatient Prescriptions on File Prior to Visit  Medication Sig Dispense Refill  . allopurinol (ZYLOPRIM) 300 MG tablet Take 1 tablet (300 mg total) by mouth daily. 90 tablet 3  . B-D 3CC LUER-LOK SYR 22GX1" 22G X 1" 3 ML MISC USE AS DIRECTED 100 each 3  . cholecalciferol (VITAMIN D) 1000 UNITS tablet Take 1,000 Units by mouth daily. 5,000iu daily    . colchicine 0.6 MG tablet TAKE 1 TABLET BY MOUTH EVERY DAY 30 tablet 2  . hydrochlorothiazide (HYDRODIURIL) 25 MG tablet Take 1 tablet (25 mg total) by mouth daily. 90 tablet 1  . Magnesium 250 MG TABS Take by mouth.    . nystatin cream (MYCOSTATIN) Apply 1 application topically 2 (two) times daily. To feet bilateral 60 g 1  . pantoprazole (PROTONIX) 40 MG tablet TAKE 1 TABLET EVERY DAY 30 MINS BEFORE FOOD 90 tablet 0  . pravastatin (PRAVACHOL) 40 MG tablet TAKE 1 TABLET BY MOUTH EVERY DAY 90 tablet 0   No current facility-administered medications on file prior to visit.   Health Maintenance:  Immunization History  Administered Date(s) Administered  . Tdap 01/23/2014   Tetanus: 2015 Pneumovax: N/A Prevnar 13: Due at age 35 Flu vaccine: N/A Zostavax: N/A DEXA: N/A Colonoscopy: 2007 and he had several polyps EGD: 01/2010 Dr. Loreta Ave + GERD CXR 01/2014 Ct chest 2015 normal CT  MRI brain/neck 2006 US scrotum 2011  Allergies: No Known Allergies Medical History:  Past Medical History  Diagnosis Date  . Hypertension   . Hyperlipidemia   . Other testicular hypofunction   . OSA (obstructive sleep apnea)   . Vitamin D deficiency disease   . Pre-diabetes   . Hypogonadism male    Surgical History: No past surgical history on file. Family History:  Family History  Problem Relation Age of Onset  . Diabetes Neg Hx   . Heart disease Neg Hx   . Hyperlipidemia Neg Hx   . Hypertension Neg Hx   . Stroke Neg Hx   . Cancer Neg Hx    Social History:   History   Substance Use Topics  . Smoking status: Former Smoker -- 1.00 packs/day    Start date: 08/08/2010  . Smokeless tobacco: Not on file  . Alcohol Use: Yes   Review of Systems  Constitutional: Positive for malaise/fatigue. Negative for fever, chills, weight loss and diaphoresis.  HENT: Negative.   Respiratory: Negative.   Cardiovascular: Negative.   Gastrointestinal: Negative.   Genitourinary: Negative.        + ED  Musculoskeletal: Positive for joint pain (right foot, better with insert). Negative for myalgias, back pain, falls and neck pain.  Neurological: Negative.  Negative for weakness.  Psychiatric/Behavioral: Negative.     Physical Exam: Estimated body mass index is 35.58 kg/(m^2) as calculated from the following:   Height as of this encounter: 5\' 10"  (1.778 m).   Weight as of this encounter: 248 lb (112.492 kg). BP 122/78 mmHg  Pulse 72  Temp(Src) 97.7 F (36.5 C)  Resp 16  Ht 5\' 10"  (1.778 m)  Wt 248 lb (112.492  kg)  BMI 35.58 kg/m2 General Appearance: Well nourished, overweight, in no apparent distress. Eyes: PERRLA, EOMs, conjunctiva no swelling or erythema, normal fundi and vessels. Sinuses: No Frontal/maxillary tenderness ENT/Mouth: Ext aud canals clear, normal light reflex with TMs without erythema, bulging. Good dentition. Very crowded mouth and very difficult to visualize post pharynx.  Hearing normal.  Neck: Supple, thyroid normal. No bruits Respiratory: Respiratory effort normal, BS equal bilaterally without rales, rhonchi, wheezing or stridor. Cardio: RRR without murmurs, rubs or gallops. Brisk peripheral pulses with 1 +edema with varicose veins.  Chest: symmetric, with normal excursions and percussion. Abdomen: Soft, +BS, rotund, Non tender, no guarding, rebound, hernias, masses, or organomegaly. .  Lymphatics: Non tender without lymphadenopathy.  Genitourinary: defer Musculoskeletal: Full ROM all peripheral extremities,5/5 strength, and normal gait. Skin:  Warm, dry without rashes, lesions, ecchymosis.  Neuro: Cranial nerves intact, reflexes equal bilaterally. Normal muscle tone, no cerebellar symptoms. Sensation intact.  Psych: Awake and oriented X 3, normal affect, Insight and Judgment appropriate.   EKG: WNL, questionable LAE, no ST changes. AORTA SCAN: WNL  Quentin Mulling 3:37 PM

## 2015-02-17 ENCOUNTER — Other Ambulatory Visit: Payer: Self-pay

## 2015-02-17 LAB — HEPATIC FUNCTION PANEL
ALBUMIN: 4.4 g/dL (ref 3.5–5.2)
ALT: 18 U/L (ref 0–53)
AST: 20 U/L (ref 0–37)
Alkaline Phosphatase: 88 U/L (ref 39–117)
Bilirubin, Direct: 0.1 mg/dL (ref 0.0–0.3)
Indirect Bilirubin: 0.3 mg/dL (ref 0.2–1.2)
Total Bilirubin: 0.4 mg/dL (ref 0.2–1.2)
Total Protein: 7.2 g/dL (ref 6.0–8.3)

## 2015-02-17 LAB — URINALYSIS, ROUTINE W REFLEX MICROSCOPIC
BILIRUBIN URINE: NEGATIVE
Glucose, UA: NEGATIVE mg/dL
HGB URINE DIPSTICK: NEGATIVE
Ketones, ur: NEGATIVE mg/dL
LEUKOCYTES UA: NEGATIVE
Nitrite: NEGATIVE
PROTEIN: NEGATIVE mg/dL
Specific Gravity, Urine: 1.018 (ref 1.005–1.030)
Urobilinogen, UA: 1 mg/dL (ref 0.0–1.0)
pH: 7.5 (ref 5.0–8.0)

## 2015-02-17 LAB — MAGNESIUM: MAGNESIUM: 1.9 mg/dL (ref 1.5–2.5)

## 2015-02-17 LAB — BASIC METABOLIC PANEL WITH GFR
BUN: 16 mg/dL (ref 6–23)
CALCIUM: 9.5 mg/dL (ref 8.4–10.5)
CO2: 28 mEq/L (ref 19–32)
CREATININE: 1.17 mg/dL (ref 0.50–1.35)
Chloride: 103 mEq/L (ref 96–112)
GFR, EST NON AFRICAN AMERICAN: 70 mL/min
GFR, Est African American: 81 mL/min
Glucose, Bld: 80 mg/dL (ref 70–99)
Potassium: 4.1 mEq/L (ref 3.5–5.3)
Sodium: 138 mEq/L (ref 135–145)

## 2015-02-17 LAB — LIPID PANEL
CHOL/HDL RATIO: 3.2 ratio
Cholesterol: 187 mg/dL (ref 0–200)
HDL: 59 mg/dL (ref >=40)
LDL Cholesterol: 92 mg/dL (ref 0–99)
Triglycerides: 178 mg/dL — ABNORMAL HIGH (ref <150)
VLDL: 36 mg/dL (ref 0–40)

## 2015-02-17 LAB — MICROALBUMIN / CREATININE URINE RATIO
CREATININE, URINE: 88.5 mg/dL
Microalb, Ur: <0.2 mg/dL (ref <2.0)

## 2015-02-17 LAB — VITAMIN D 25 HYDROXY (VIT D DEFICIENCY, FRACTURES): Vit D, 25-Hydroxy: 29 ng/mL — ABNORMAL LOW (ref 30–100)

## 2015-02-17 LAB — TSH: TSH: 1.293 u[IU]/mL (ref 0.350–4.500)

## 2015-02-17 LAB — HEMOGLOBIN A1C
Hgb A1c MFr Bld: 5.5 % (ref <5.7)
Mean Plasma Glucose: 111 mg/dL (ref <117)

## 2015-02-17 LAB — URIC ACID: Uric Acid, Serum: 8.5 mg/dL — ABNORMAL HIGH (ref 4.0–7.8)

## 2015-02-17 LAB — INSULIN, FASTING: Insulin fasting, serum: 13.4 u[IU]/mL (ref 2.0–19.6)

## 2015-02-17 LAB — HIV ANTIBODY (ROUTINE TESTING W REFLEX): HIV: NONREACTIVE

## 2015-02-17 LAB — PSA: PSA: 0.41 ng/mL (ref <=4.00)

## 2015-02-17 MED ORDER — TADALAFIL 20 MG PO TABS: 20.0000 mg | ORAL_TABLET | Freq: Every day | ORAL | Status: DC | PRN

## 2015-03-16 ENCOUNTER — Ambulatory Visit: Payer: Self-pay | Admitting: Physician Assistant

## 2015-04-03 ENCOUNTER — Other Ambulatory Visit: Payer: Self-pay | Admitting: Internal Medicine

## 2015-04-22 ENCOUNTER — Ambulatory Visit: Admit: 2015-04-22 | Primary: Family Medicine

## 2015-04-22 ENCOUNTER — Encounter

## 2015-04-22 DIAGNOSIS — R0781 Pleurodynia: Secondary | ICD-10-CM

## 2015-05-02 ENCOUNTER — Other Ambulatory Visit: Payer: Self-pay | Admitting: Internal Medicine

## 2015-05-19 ENCOUNTER — Other Ambulatory Visit: Payer: Self-pay | Admitting: Physician Assistant

## 2015-05-19 ENCOUNTER — Other Ambulatory Visit: Payer: Self-pay

## 2015-05-19 MED ORDER — SCOPOLAMINE 1 MG/3DAYS TD PT72: 1.0000 | MEDICATED_PATCH | TRANSDERMAL | Status: DC

## 2015-05-19 MED ORDER — MECLIZINE HCL 25 MG PO TABS: ORAL_TABLET | ORAL | Status: DC

## 2015-05-19 MED ORDER — COLCHICINE 0.6 MG PO TABS: 0.6000 mg | ORAL_TABLET | Freq: Every day | ORAL | Status: DC

## 2015-05-25 ENCOUNTER — Ambulatory Visit: Payer: Self-pay | Admitting: Physician Assistant

## 2015-06-20 ENCOUNTER — Other Ambulatory Visit: Payer: Self-pay | Admitting: Physician Assistant

## 2015-06-22 ENCOUNTER — Other Ambulatory Visit: Payer: Self-pay

## 2015-06-22 MED ORDER — PANTOPRAZOLE SODIUM 40 MG PO TBEC: DELAYED_RELEASE_TABLET | ORAL | Status: DC

## 2015-07-30 ENCOUNTER — Other Ambulatory Visit: Payer: Self-pay | Admitting: Internal Medicine

## 2015-08-04 ENCOUNTER — Other Ambulatory Visit: Payer: Self-pay | Admitting: *Deleted

## 2015-08-04 MED ORDER — TRAZODONE HCL 150 MG PO TABS: ORAL_TABLET | ORAL | Status: DC

## 2015-08-21 LAB — HM COLONOSCOPY

## 2015-08-31 ENCOUNTER — Ambulatory Visit: Payer: Self-pay | Admitting: Internal Medicine

## 2015-09-13 ENCOUNTER — Other Ambulatory Visit: Payer: Self-pay | Admitting: Physician Assistant

## 2015-09-17 ENCOUNTER — Other Ambulatory Visit: Payer: Self-pay | Admitting: Physician Assistant

## 2015-09-24 ENCOUNTER — Ambulatory Visit: Payer: Self-pay | Admitting: Internal Medicine

## 2015-09-25 ENCOUNTER — Other Ambulatory Visit: Payer: Self-pay | Admitting: Physician Assistant

## 2015-10-05 ENCOUNTER — Ambulatory Visit
Admit: 2015-10-05 | Discharge: 2015-10-05 | Payer: PRIVATE HEALTH INSURANCE | Attending: Cardiovascular Disease | Primary: Family Medicine

## 2015-10-05 DIAGNOSIS — I472 Ventricular tachycardia, unspecified: Secondary | ICD-10-CM

## 2015-10-05 LAB — LIPID PANEL
Cholesterol, Total: 198 mg/dL
HDL: 62 mg/dL (ref 35–70)
LDL Calculated: 98 mg/dL (ref 0–160)
Triglycerides: 197 mg/dL

## 2015-10-05 NOTE — Progress Notes (Signed)
[name]  MD  [facility]                                  Dipl. [facility],  CBC[facility], RPVI                                     OFFICE  FOLLOWUP NOTE               Dear [name] ,    Your patient Carl Castro with past medical  history of  HTN, arrythmia  was seen in the office today.    Reports compliance with medicines. Patient  during this office visit  does not have   cardiac  complains.          Vitals:    10/05/15 1102   BP: 120/84   Pulse: 70       Current Outpatient Prescriptions   Medication Sig Dispense Refill   ??? aspirin 81 MG tablet Take 81 mg by mouth daily.     ??? rosuvastatin (CRESTOR) 5 MG tablet Take 5 mg by mouth daily.     ??? metronidazole (METROGEL) 0.75 % gel Apply  topically daily. Apply topically 2 times daily.       No current facility-administered medications for this visit.      Allergies: Review of patient's allergies indicates no known allergies.  Past Medical History   Diagnosis Date   ??? Atypical chest pain    ??? Family history of early CAD    ??? Frequent PVCs    ??? H/O 24 hour EKG monitoring 11/03/11     48 hour- the predominant rhythm is sinus rhythm with one 10-beat run of nonsustained ventricular tachycardia, the patient is already being referred for EP study to Dr. Dianah Field in [place]   ??? H/O cardiovascular stress test 09/15/2011     09/15/2011-Normal study. No ischemia. LVSF normal. EF 63%.    ??? H/O echocardiogram 09/15/2011     09/15/2011- LVSF normal. EF 55%.Impaired LV relaxation. Mild TR.-report in epic   ??? History of complete ECG 09/08/2011   ??? Hyperlipidemia    ??? Palpitations    ??? S/P cardiac cath 11/11/2011     normal coronaries. reffer for EP study   ??? S/P radiofrequency ablation operation for arrhythmia    ??? Tachycardia      History reviewed. No pertinent past surgical history.  Family History   Problem Relation Age of Onset   ??? Heart Disease Father      CMP A-fib     Social History   Substance Use Topics   ??? Smoking status: Former Smoker   ??? Smokeless  tobacco: Former Neurosurgeon   ??? Alcohol use 2.4 oz/week     4 Cans of beer per week        Review of Systems:   ?? Constitutional: No Fever or Weight Loss   ?? Eyes: No Decreased Vision  ?? ENT: No Headaches, Hearing Loss or Vertigo  ?? Cardiovascular: No chest pain, dyspnea on exertion, palpitations or loss of consciousness  ?? Respiratory: No cough or wheezing    ?? Gastrointestinal: No abdominal pain, appetite loss, blood in stools, constipation, diarrhea or heartburn  ?? Genitourinary: No dysuria, trouble voiding, or hematuria  ?? Musculoskeletal:  none  ?? Integumentary: No rash or pruritis  ?? Neurological:  No TIA or stroke symptoms  ?? Psychiatric: No anxiety or depression  ?? Endocrine: No malaise, fatigue or temperature intolerance  ?? Hematologic/Lymphatic: No bleeding problems, blood clots or swollen lymph nodes  ?? Allergic/Immunologic: No nasal congestion or hives    Objective:      Physical Exam:  Visit Vitals   ??? BP 120/84 (Site: Left Arm, Position: Sitting)   ??? Pulse 70   ??? Ht 6\' 3"  (1.905 m)   ??? Wt 248 lb (112.5 kg)   ??? BMI 31 kg/m2     Wt Readings from Last 3 Encounters:   10/05/15 248 lb (112.5 kg)   09/29/14 255 lb (115.7 kg)   12/25/13 258 lb (117 kg)     Body mass index is 31 kg/(m^2).  GENERAL - Alert, oriented, pleasant, in no apparent distress.    Head unremarkable  Eyes  Not injected conjunctiva  ENT ??? normal mucosa  Neck - Supple.  No jugular venous distention noted. No carotid bruits.   Cardiovascular ??? Normal S1 and S2 without obvious murmur or gallop.    Extremities - No cyanosis, clubbing, or significant edema.    Pulmonary ??? No respiratory distress.  No wheezes or rales.    Pulses: Bilateral radial and pedal pulses normal  Abdomen ??? no tenderness  Musculoskeletal ??? normal strength  Neurologic ???   There are  no gross focal neurologic abnormalities.  Skin-  No rash  Affect; normal mood    DATA:    No results found for: CKTOTAL, CKMB, CKMBINDEX, TROPONINI  BNP:  No results found for: BNP  PT/INR:  No results  found for: PTINR  No results found for: LABA1C  Lab Results   Component Value Date    CHOL 198 05/09/2015    TRIG 197 05/09/2015    HDL 62 05/09/2015    LDLCALC 98 05/09/2015     No results found for: ALT, AST  TSH:  No results found for: TSH        QUALITY MEASURES:  CAD:  No   CHOL LOWERING:  No- if No Why  ANTIPLATELET:  No - if No why  BETA BLOCKER    no  IF  NO WHY  SMOKING HISTORY no Counselled no    Assessment/ Plan:     Patient seen , interviewed and examined :  TESTING ORDERED TODAY: no     - arrythmia:  Stable  No PVCs    -  Hypertension: Patients blood pressure is normal. Patient is advised about low sodium diet. Present medical regimen will not be changed.       -  LIPID MANAGEMENT:  Available lipid  lab data reviewed  and patient was given dietary advice.    -   Changes  in medicines made: No     -  Test ordered today : No                                 FU in 12months

## 2015-11-25 ENCOUNTER — Encounter: Payer: Self-pay | Admitting: Internal Medicine

## 2015-11-25 ENCOUNTER — Ambulatory Visit (INDEPENDENT_AMBULATORY_CARE_PROVIDER_SITE_OTHER): Payer: BLUE CROSS/BLUE SHIELD | Admitting: Internal Medicine

## 2015-11-25 VITALS — BP 128/72 | HR 64 | Temp 98.2°F | Resp 18 | Ht 70.0 in | Wt 250.0 lb

## 2015-11-25 DIAGNOSIS — K5900 Constipation, unspecified: Secondary | ICD-10-CM | POA: Diagnosis not present

## 2015-11-25 MED ORDER — LUBIPROSTONE 24 MCG PO CAPS: 24.0000 ug | ORAL_CAPSULE | Freq: Two times a day (BID) | ORAL | Status: DC

## 2015-11-25 NOTE — Patient Instructions (Signed)

## 2015-11-25 NOTE — Progress Notes (Signed)
HPI  Patient complains of AB pain. Onset was 3 day ago. Symptoms have been gradually worsening. The pain is described as sharp. Pain is located in the periumbilical region and LLQ without radiation.  Aggravating factors: protonix.  Alleviating factors: nexium, probiotics. Associated symptoms: anorexia, belching, constipation and flatus. The patient denies anorexia, arthralagias, chills, diarrhea, dysuria, fever, frequency, headache, hematochezia, hematuria, melena, myalgias, nausea, sweats and vomiting.  He did have a bowel movement today and did have some relief from the pain.  He has tried taking dulcolax.  Normally he does go to the bathroom every other day.  He was doing a little bit more fast foods lately.  He feels like  His eating habits have gotten screwed up.    Review of Systems  Constitutional: Negative for fever, chills and malaise/fatigue.  HENT: Negative for congestion, ear pain and sore throat.   Respiratory: Negative for cough, shortness of breath and wheezing.   Cardiovascular: Negative for chest pain, palpitations and leg swelling.  Gastrointestinal: Positive for heartburn, abdominal pain and constipation. Negative for nausea, vomiting, diarrhea, blood in stool and melena.  Genitourinary: Negative.   Skin: Negative.   Neurological: Negative for dizziness, sensory change, loss of consciousness and headaches.  Psychiatric/Behavioral: Negative for depression. The patient is not nervous/anxious and does not have insomnia.     Physical exam: Filed Vitals:   11/25/15 1601  BP: 128/72  Pulse: 64  Temp: 98.2 F (36.8 C)  Resp: 18   General:  Well developed well nourished, non-toxic appearing, NAD Head:  NCAT, PERLA, normal EOM, conjunctiva normal, ears clear bilaterally with normal TMs, Oropharynx clear and moist without exudate, no oropharyngeal erythema or swelling Neck:  No JVD, no cervical adenopathy, no thyromegaly Lungs:  Clear to auscultation A&P, no wheeze, rhonchi,  rales.  Normal effort Heart:  RRR, no MRGs, normal peripheral pulses Abd:  +BS x 4, no distention, soft, mildly tender in suprapubic and LLQ, negative McBurneys, negative heel pound, with no guarding, rigidity, or rebound.   GU:  No CVA tenderness bilaterally Skin:  No rash, warm and dry Neuro:  A&O x 3, CN II-XII intact, normal gait Psych:  Normal affect, no ideations, normal judgement and insight  Assessment and Plan:   1. Constipation, unspecified constipation type -likely from recent travel and poor water intake.  Recommended daily amitiza until normal bowel movements or stop if diarrhea. -increase water intake -increase fiber intake - DG Abd 2 Views; Future - lubiprostone (AMITIZA) 24 MCG capsule; Take 1 capsule (24 mcg total) by mouth 2 (two) times daily with a meal.  Dispense: 14 capsule; Refill: 0

## 2015-11-26 ENCOUNTER — Ambulatory Visit (HOSPITAL_COMMUNITY)
Admission: RE | Admit: 2015-11-26 | Discharge: 2015-11-26 | Disposition: A | Discharge status: Home / Self Care | Payer: BLUE CROSS/BLUE SHIELD | Source: Ambulatory Visit | Attending: Internal Medicine | Admitting: Internal Medicine

## 2015-11-26 DIAGNOSIS — R109 Unspecified abdominal pain: Secondary | ICD-10-CM | POA: Insufficient documentation

## 2015-11-26 DIAGNOSIS — K5900 Constipation, unspecified: Secondary | ICD-10-CM | POA: Diagnosis not present

## 2015-11-26 DIAGNOSIS — M160 Bilateral primary osteoarthritis of hip: Secondary | ICD-10-CM | POA: Insufficient documentation

## 2015-11-28 ENCOUNTER — Other Ambulatory Visit: Payer: Self-pay | Admitting: Internal Medicine

## 2015-11-30 ENCOUNTER — Ambulatory Visit: Payer: Self-pay | Admitting: Internal Medicine

## 2015-12-07 ENCOUNTER — Other Ambulatory Visit: Payer: Self-pay | Admitting: Physician Assistant

## 2015-12-07 ENCOUNTER — Other Ambulatory Visit: Payer: Self-pay | Admitting: *Deleted

## 2015-12-07 MED ORDER — PANTOPRAZOLE SODIUM 40 MG PO TBEC: DELAYED_RELEASE_TABLET | ORAL | Status: DC

## 2015-12-21 ENCOUNTER — Ambulatory Visit (INDEPENDENT_AMBULATORY_CARE_PROVIDER_SITE_OTHER): Payer: BLUE CROSS/BLUE SHIELD | Admitting: Internal Medicine

## 2015-12-21 ENCOUNTER — Encounter: Payer: Self-pay | Admitting: Internal Medicine

## 2015-12-21 VITALS — BP 144/80 | HR 72 | Temp 98.0°F | Resp 18 | Ht 70.0 in | Wt 252.0 lb

## 2015-12-21 DIAGNOSIS — E782 Mixed hyperlipidemia: Secondary | ICD-10-CM

## 2015-12-21 DIAGNOSIS — E559 Vitamin D deficiency, unspecified: Secondary | ICD-10-CM

## 2015-12-21 DIAGNOSIS — R7303 Prediabetes: Secondary | ICD-10-CM | POA: Diagnosis not present

## 2015-12-21 DIAGNOSIS — Z79899 Other long term (current) drug therapy: Secondary | ICD-10-CM

## 2015-12-21 DIAGNOSIS — I10 Essential (primary) hypertension: Secondary | ICD-10-CM | POA: Diagnosis not present

## 2015-12-21 MED ORDER — SILDENAFIL CITRATE 20 MG PO TABS: 20.0000 mg | ORAL_TABLET | Freq: Three times a day (TID) | ORAL | Status: DC

## 2015-12-21 MED ORDER — LUBIPROSTONE 24 MCG PO CAPS: 24.0000 ug | ORAL_CAPSULE | Freq: Every day | ORAL | Status: DC

## 2015-12-21 NOTE — Progress Notes (Signed)
Assessment and Plan:  Hypertension:  -Continue medication,  -monitor blood pressure at home.  -Continue DASH diet.   -Reminder to go to the ER if any CP, SOB, nausea, dizziness, severe HA, changes vision/speech, left arm numbness and tingling, and jaw pain.  Cholesterol: -Continue diet and exercise.   Pre-diabetes: -Continue diet and exercise.   Vitamin D Def: -continue medications.   Chronic constipation -amitiza prescription sent in  NO LABWORK AS PHYSICAL IS IN 2 MONTHS  Continue diet and meds as discussed. Further disposition pending results of labs.  HPI 55 y.o. male  presents for 3 month follow up with hypertension, hyperlipidemia, prediabetes and vitamin D.   His blood pressure has been controlled at home, today their BP is BP: (!) 144/80 mmHg.   He does not workout. He denies chest pain, shortness of breath, dizziness.  He reports that he has been doing some more exercise.     He is on cholesterol medication and denies myalgias. His cholesterol is at goal. The cholesterol last visit was:   Lab Results  Component Value Date   CHOL 187 02/16/2015   HDL 59 02/16/2015   LDLCALC 92 02/16/2015   TRIG 178* 02/16/2015   CHOLHDL 3.2 02/16/2015     He has been working on diet and exercise for prediabetes, and denies foot ulcerations, hyperglycemia, hypoglycemia , increased appetite, nausea, paresthesia of the feet, polydipsia, polyuria, visual disturbances, vomiting and weight loss. Last A1C in the office was:  Lab Results  Component Value Date   HGBA1C 5.5 02/16/2015    Patient is on Vitamin D supplement.  Lab Results  Component Value Date   VD25OH 62* 02/16/2015     He reports that his constipation was a lot better when he was taking the [place].  He reports that he has been increasing his water and also increasing his fiber intake.     Current Medications:  Current Outpatient Prescriptions on File Prior to Visit  Medication Sig Dispense Refill  . B-D 3CC  LUER-LOK SYR 22GX1" 22G X 1" 3 ML MISC USE AS DIRECTED 100 each 3  . hydrochlorothiazide (HYDRODIURIL) 25 MG tablet Take 1 tablet (25 mg total) by mouth daily. 90 tablet 1  . losartan (COZAAR) 50 MG tablet Take 1 tablet (50 mg total) by mouth daily. 90 tablet 3  . lubiprostone (AMITIZA) 24 MCG capsule Take 1 capsule (24 mcg total) by mouth 2 (two) times daily with a meal. 14 capsule 0  . Magnesium 250 MG TABS Take by mouth.    . meclizine (ANTIVERT) 25 MG tablet 1/2-1 pill up to 3 times daily for motion sickness/dizziness 30 tablet 0  . nystatin cream (MYCOSTATIN) Apply 1 application topically 2 (two) times daily. To feet bilateral 60 g 1  . pantoprazole (PROTONIX) 40 MG tablet TAKE 1 TABLET BY MOUTH EVERY DAY 30 MINUTES BEFORE FOOD 90 tablet 2  . pravastatin (PRAVACHOL) 40 MG tablet TAKE 1 TABLET BY MOUTH EVERY DAY 90 tablet 0  . testosterone cypionate (DEPOTESTOSTERONE CYPIONATE) 200 MG/ML injection INJECT IM EVERY 2 WEEKS 10 mL 5  . traZODone (DESYREL) 150 MG tablet Take 1/3 to 1/2 to 1 tablet 1 hour before sleep. 30 tablet 2   No current facility-administered medications on file prior to visit.    Medical History:  Past Medical History  Diagnosis Date  . Hypertension   . Hyperlipidemia   . Other testicular hypofunction   . OSA (obstructive sleep apnea)   . Vitamin D deficiency  disease   . Pre-diabetes   . Hypogonadism male     Allergies: No Known Allergies   Review of Systems:  Review of Systems  Constitutional: Negative for fever, chills and malaise/fatigue.  HENT: Negative for congestion, ear pain and sore throat.   Respiratory: Negative for cough, shortness of breath and wheezing.   Cardiovascular: Negative for chest pain, palpitations and leg swelling.  Gastrointestinal: Positive for constipation. Negative for heartburn, abdominal pain, diarrhea, blood in stool and melena.  Genitourinary: Negative.   Skin: Negative.   Neurological: Negative for dizziness, loss of  consciousness and headaches.  Psychiatric/Behavioral: Negative for depression. The patient is not nervous/anxious and does not have insomnia.     Family history- Review and unchanged  Social history- Review and unchanged  Physical Exam: BP 144/80 mmHg  Pulse 72  Temp(Src) 98 F (36.7 C) (Temporal)  Resp 18  Ht 5\' 10"  (1.778 m)  Wt 252 lb (114.306 kg)  BMI 36.16 kg/m2 Wt Readings from Last 3 Encounters:  12/21/15 252 lb (114.306 kg)  11/25/15 250 lb (113.399 kg)  02/16/15 248 lb (112.492 kg)    General Appearance: Well nourished well developed, in no apparent distress. Eyes: PERRLA, EOMs, conjunctiva no swelling or erythema ENT/Mouth: Ear canals normal without obstruction, swelling, erythma, discharge.  TMs normal bilaterally.  Oropharynx moist, clear, without exudate, or postoropharyngeal swelling. Neck: Supple, thyroid normal,no cervical adenopathy  Respiratory: Respiratory effort normal, Breath sounds clear A&P without rhonchi, wheeze, or rale.  No retractions, no accessory usage. Cardio: RRR with no MRGs. Brisk peripheral pulses without edema.  Abdomen: Soft, + BS,  Non tender, no guarding, rebound, hernias, masses. Musculoskeletal: Full ROM, 5/5 strength, Normal gait Skin: Warm, dry without rashes, lesions, ecchymosis.  Neuro: Awake and oriented X 3, Cranial nerves intact. Normal muscle tone, no cerebellar symptoms. Psych: Normal affect, Insight and Judgment appropriate.    Terri Piedraourtney Forcucci, PA-C 4:26 PM [facility]

## 2015-12-24 ENCOUNTER — Other Ambulatory Visit: Payer: Self-pay | Admitting: Internal Medicine

## 2015-12-24 MED ORDER — SILDENAFIL CITRATE 20 MG PO TABS: 20.0000 mg | ORAL_TABLET | Freq: Three times a day (TID) | ORAL | Status: DC

## 2016-02-01 ENCOUNTER — Other Ambulatory Visit: Payer: Self-pay | Admitting: Physician Assistant

## 2016-02-01 ENCOUNTER — Encounter: Payer: Self-pay | Admitting: Physician Assistant

## 2016-02-01 ENCOUNTER — Other Ambulatory Visit: Payer: Self-pay | Admitting: Internal Medicine

## 2016-02-01 MED ORDER — SILDENAFIL CITRATE 20 MG PO TABS: ORAL_TABLET | ORAL | Status: DC

## 2016-02-18 ENCOUNTER — Encounter: Payer: Self-pay | Admitting: Physician Assistant

## 2016-03-01 ENCOUNTER — Other Ambulatory Visit: Payer: Self-pay | Admitting: Physician Assistant

## 2016-03-16 ENCOUNTER — Ambulatory Visit (INDEPENDENT_AMBULATORY_CARE_PROVIDER_SITE_OTHER): Payer: BLUE CROSS/BLUE SHIELD | Admitting: Physician Assistant

## 2016-03-16 ENCOUNTER — Encounter: Payer: Self-pay | Admitting: Physician Assistant

## 2016-03-16 VITALS — BP 128/66 | HR 82 | Temp 97.9°F | Ht 70.0 in | Wt 255.0 lb

## 2016-03-16 DIAGNOSIS — M10041 Idiopathic gout, right hand: Secondary | ICD-10-CM

## 2016-03-16 DIAGNOSIS — M109 Gout, unspecified: Secondary | ICD-10-CM

## 2016-03-16 LAB — CBC WITH DIFFERENTIAL/PLATELET
BASOS ABS: 0 {cells}/uL (ref 0–200)
BASOS PCT: 0 %
EOS ABS: 104 {cells}/uL (ref 15–500)
Eosinophils Relative: 1 %
HEMATOCRIT: 40.5 % (ref 38.5–50.0)
HEMOGLOBIN: 13.6 g/dL (ref 13.2–17.1)
Lymphocytes Relative: 19 %
Lymphs Abs: 1976 cells/uL (ref 850–3900)
MCH: 30.5 pg (ref 27.0–33.0)
MCHC: 33.6 g/dL (ref 32.0–36.0)
MCV: 90.8 fL (ref 80.0–100.0)
MONO ABS: 1040 {cells}/uL — AB (ref 200–950)
MPV: 10.1 fL (ref 7.5–12.5)
Monocytes Relative: 10 %
NEUTROS ABS: 7280 {cells}/uL (ref 1500–7800)
Neutrophils Relative %: 70 %
Platelets: 221 10*3/uL (ref 140–400)
RBC: 4.46 MIL/uL (ref 4.20–5.80)
RDW: 13.6 % (ref 11.0–15.0)
WBC: 10.4 10*3/uL (ref 3.8–10.8)

## 2016-03-16 MED ORDER — PREDNISONE 20 MG PO TABS: ORAL_TABLET | ORAL | 1 refills | Status: DC

## 2016-03-16 NOTE — Progress Notes (Signed)
Subjective:    Patient ID: [name], male    DOB: 04/07/1961, 55 y.o.   MRN: [id number]  HPI 55 y.o. right handed AAM presents with gout x 2 days. He does have a history of gout. Woke up with right hand swelling, no known injury. Has swelling, redness, and some warmth. Hurts worse at 1st and 2nd MCP. He started back on the allopurinol with the swelling.    Blood pressure 128/66, pulse 82, temperature 97.9 F (36.6 C), height 5\' 10"  (1.778 m), weight 255 lb (115.7 kg).  Medications Current Outpatient Prescriptions on File Prior to Visit  Medication Sig  . B-D 3CC LUER-LOK SYR 22GX1" 22G X 1" 3 ML MISC USE AS DIRECTED  . hydrochlorothiazide (HYDRODIURIL) 25 MG tablet Take 1 tablet (25 mg total) by mouth daily.  Marland Kitchen. losartan (COZAAR) 50 MG tablet TAKE 1 TABLET (50 MG TOTAL) BY MOUTH DAILY.  Marland Kitchen. lubiprostone (AMITIZA) 24 MCG capsule Take 1 capsule (24 mcg total) by mouth daily with breakfast.  . Magnesium 250 MG TABS Take by mouth.  . meclizine (ANTIVERT) 25 MG tablet 1/2-1 pill up to 3 times daily for motion sickness/dizziness  . nystatin cream (MYCOSTATIN) Apply 1 application topically 2 (two) times daily. To feet bilateral  . pantoprazole (PROTONIX) 40 MG tablet TAKE 1 TABLET BY MOUTH EVERY DAY 30 MINUTES BEFORE FOOD  . pravastatin (PRAVACHOL) 40 MG tablet TAKE 1 TABLET BY MOUTH EVERY DAY  . sildenafil (REVATIO) 20 MG tablet Take 3 to 5 tablets as need for ED  . testosterone cypionate (DEPOTESTOSTERONE CYPIONATE) 200 MG/ML injection INJECT 2ML IM EVERY 2 WEEKS  . traZODone (DESYREL) 150 MG tablet Take 1/3 to 1/2 to 1 tablet 1 hour before sleep.   No current facility-administered medications on file prior to visit.     Problem list He has Essential hypertension; Mixed hyperlipidemia; Pre-diabetes; Vitamin D deficiency disease; Hypogonadism male; OSA (obstructive sleep apnea); Obesity; ED (erectile dysfunction); Gout; Medication management; and Noncompliance on his problem  list.  Review of Systems  Constitutional: Negative.   HENT: Negative.   Respiratory: Negative.   Cardiovascular: Negative.   Gastrointestinal: Negative.   Genitourinary: Negative.   Musculoskeletal: Positive for arthralgias and joint swelling.  Skin: Negative.   Neurological: Negative.   Psychiatric/Behavioral: Negative.        Objective:   Physical Exam  Constitutional: He is oriented to person, place, and time. He appears well-developed and well-nourished.  HENT:  Head: Normocephalic and atraumatic.  Right Ear: External ear normal.  Left Ear: External ear normal.  Mouth/Throat: Oropharynx is clear and moist.  Eyes: Conjunctivae and EOM are normal. Pupils are equal, round, and reactive to light.  Neck: Normal range of motion. Neck supple.  Cardiovascular: Normal rate, regular rhythm and normal heart sounds.   Pulmonary/Chest: Effort normal and breath sounds normal.  Abdominal: Soft. Bowel sounds are normal.  Musculoskeletal: Normal range of motion.  + swelling, warmth, tenderness to palpation right 1st and 2nd MCP, with good distal neurovascular, decreased ROM due to swelling and pain.   Neurological: He is alert and oriented to person, place, and time. No cranial nerve deficit.  Skin: Skin is warm and dry.  Psychiatric: He has a normal mood and affect. His behavior is normal.       Assessment & Plan:  1. Acute gout of right hand, unspecified cause - predniSONE (DELTASONE) 20 MG tablet; 2 tablets daily for 3 days, 1 tablet daily for 4 days.  Dispense: 10  tablet; Refill: 1 - CBC with Differential/Platelet - BASIC METABOLIC PANEL WITH GFR - Hepatic function panel - Uric acid

## 2016-03-16 NOTE — Patient Instructions (Signed)
Stay on allopurinol  Take prednisone  Information for patients with Gout  Gout defined-Gout occurs when urate crystals accumulate in your joint causing the inflammation and intense pain of gout attack.  Urate crystals can form when you have high levels of uric acid in your blood.  Your body produces uric acid when it breaks down prurines-substances that are found naturally in your body, as well as in certain foods such as organ meats, anchioves, herring, asparagus, and mushrooms.  Normally uric acid dissolves in your blood and passes through your kidneys into your urine.  But sometimes your body either produces too much uric acid or your kidneys excrete too little uric acid.  When this happens, uric acid can build up, forming sharp needle-like urate crystals in a joint or surrounding tissue that cause pain, inflammation and swelling.    Gout is characterized by sudden, severe attacks of pain, redness and tenderness in joints, often the joint at the base of the big toe.  Gout is complex form of arthritis that can affect anyone.  Men are more likely to get gout but women become increasingly more susceptible to gout after menopause.  An acute attack of gout can wake you up in the middle of the night with the sensation that your big toe is on fire.  The affected joint is hot, swollen and so tender that even the weight or the sheet on it may seem intolerable.  If you experience symptoms of an acute gout attack it is important to your doctor as soon as the symptoms start.  Gout that goes untreated can lead to worsening pain and joint damage.  Risk Factors:  You are more likely to develop gout if you have high levels of uric acid in your body.    Factors that increase the uric acid level in your body include:  Lifestyle factors.  Excessive alcohol use-generally more than two drinks a day for men and more than one for women increase the risk of gout.  Medical conditions.  Certain conditions make it  more likely that you will develop gout.  These include hypertension, and chronic conditions such as diabetes, high levels of fat and cholesterol in the blood, and narrowing of the arteries.  Certain medications.  The uses of Thiazide diuretics- commonly used to treat hypertension and low dose aspirin can also increase uric acid levels.  Family history of gout.  If other members of your family have had gout, you are more likely to develop the disease.  Age and sex. Gout occurs more often in men than it does in women, primarily because women tend to have lower uric acid levels than men do.  Men are more likely to develop gout earlier usually between the ages of 59-50- whereas women generally develop signs and symptoms after menopause.    Tests and diagnosis:  Tests to help diagnose gout may include:  Blood test.  Your doctor may recommend a blood test to measure the uric acid level in your blood .  Blood tests can be misleading, though.  Some people have high uric acid levels but never experience gout.  And some people have signs and symptoms of gout, but don't have unusual levels of uric acid in their blood.  Joint fluid test.  Your doctor may use a needle to draw fluid from your affected joint.  When examined under the microscope, your joint fluid may reveal urate crystals.  Treatment:  Treatment for gout usually involves medications.  What medications  you and your doctor choose will be based on your current health and other medications you currently take.  Gout medications can be used to treat acute gout attacks and prevent future attacks as well as reduce your risk of complications from gout such as the development of tophi from urate crystal deposits.  Alternative medicine:   Certain foods have been studied for their potential to lower uric acid levels, including:  Coffee.  Studies have found an association between coffee drinking (regular and decaf) and lower uric acid levels.  The  evidence is not enough to encourage non-coffee drinkers to start, but it may give clues to new ways of treating gout in the future.  Vitamin C.  Supplements containing vitamin C may reduce the levels of uric acid in your blood.  However, vitamin as a treatment for gout. Don't assume that if a little vitamin C is good, than lots is better.  Megadoses of vitamin C may increase your bodies uric acid levels.  Cherries.  Cherries have been associated with lower levels of uric acid in studies, but it isn't clear if they have any effect on gout signs and symptoms.  Eating more cherries and other dar-colored fruits, such as blackberries, blueberries, purple grapes and raspberries, may be a safe way to support your gout treatment.    Lifestyle/Diet Recommendations:   Drink 8 to 16 cups ( about 2 to 4 liters) of fluid each day, with at least half being water.  Avoid alcohol  Eat a moderate amount of protein, preferably from healthy sources, such as low-fat or fat-free dairy, tofu, eggs, and nut butters.  Limit you daily intake of meat, fish, and poultry to 4 to 6 ounces.  Avoid high fat meats and desserts.  Decrease you intake of shellfish, beef, lamb, pork, eggs and cheese.  Choose a good source of vitamin C daily such as citrus fruits, strawberries, broccoli,  brussel sprouts, papaya, and cantaloupe.   Choose a good source of vitamin A every other day such as yellow fruits, or dark green/yellow vegetables.  Avoid drastic weigh reduction or fasting.  If weigh loss is desired lose it over a period of several months.  See "dietary considerations.." chart for specific food recommendations.  Dietary Considerations for people with Gout  Food with negligible purine content (0-15 mg of purine nitrogen per 100 grams food)  May use as desired except on calorie variations  Non fat milk Cocoa Cereals (except in list II) Hard candies  Buttermilk Carbonated drinks Vegetables (except in list II) Sherbet   Coffee Fruits Sugar Honey  Tea Cottage Cheese Gelatin-jell-o Salt  Fruit juice Breads Angel food Cake   Herbs/spices Jams/Jellies [employer]    Foods that do not contain excessive purine content, but must be limited due to fat content  Cream Eggs Oil and Salad Dressing  Half and Half Peanut Butter Chocolate  Whole Milk Cakes Potato Chips  Butter Ice Cream Fried Foods  Cheese Nuts Waffles, pancakes   List II: Food with moderate purine content (50-150 mg of purine nitrogen per 100 grams of food)  Limit total amount each day to 5 oz. cooked Lean meat, other than those on list III   Poultry, other than those on list III Fish, other than those on list III   Seafood, other than those on list III  These foods may be used occasionally  Peas Lentils Bran  Spinach Oatmeal Dried Beans and Peas  Asparagus Wheat Germ Mushrooms   Additional  information about meat choices  Choose fish and poultry, particularly without skin, often.  Select lean, well trimmed cuts of meat.  Avoid all fatty meats, bacon , sausage, fried meats, fried fish, or poultry, luncheon meats, cold cuts, hot dogs, meats canned or frozen in gravy, spareribs and frozen and packaged prepared meats.   List III: Foods with HIGH purine content / Foods to AVOID (150-800 mg of purine nitrogen per 100 grams of food)  Anchovies Herring Meat Broths  Liver Mackerel Meat Extracts  Kidney Scallops Meat Drippings  Sardines Wild Game Mincemeat  Sweetbreads Goose Gravy  Heart Tongue Yeast, baker's and brewers   Commercial soups made with any of the foods listed in List II or List III  In addition avoid all alcoholic beverages

## 2016-03-17 ENCOUNTER — Ambulatory Visit: Payer: Self-pay | Admitting: Internal Medicine

## 2016-03-17 LAB — BASIC METABOLIC PANEL WITH GFR
BUN: 11 mg/dL (ref 7–25)
CHLORIDE: 107 mmol/L (ref 98–110)
CO2: 18 mmol/L — AB (ref 20–31)
CREATININE: 1.08 mg/dL (ref 0.70–1.33)
Calcium: 9.2 mg/dL (ref 8.6–10.3)
GFR, Est African American: 89 mL/min (ref >=60)
GFR, Est Non African American: 77 mL/min (ref >=60)
GLUCOSE: 71 mg/dL (ref 65–99)
POTASSIUM: 4 mmol/L (ref 3.5–5.3)
Sodium: 141 mmol/L (ref 135–146)

## 2016-03-17 LAB — HEPATIC FUNCTION PANEL
ALBUMIN: 4.2 g/dL (ref 3.6–5.1)
ALK PHOS: 69 U/L (ref 40–115)
ALT: 11 U/L (ref 9–46)
AST: 15 U/L (ref 10–35)
BILIRUBIN TOTAL: 0.8 mg/dL (ref 0.2–1.2)
Bilirubin, Direct: 0.1 mg/dL (ref <=0.2)
Indirect Bilirubin: 0.7 mg/dL (ref 0.2–1.2)
TOTAL PROTEIN: 6.8 g/dL (ref 6.1–8.1)

## 2016-03-17 LAB — URIC ACID: Uric Acid, Serum: 6.8 mg/dL (ref 4.0–8.0)

## 2016-03-21 ENCOUNTER — Encounter (HOSPITAL_COMMUNITY): Payer: Self-pay

## 2016-03-21 ENCOUNTER — Emergency Department (HOSPITAL_COMMUNITY): Payer: BLUE CROSS/BLUE SHIELD

## 2016-03-21 ENCOUNTER — Emergency Department (HOSPITAL_COMMUNITY)
Admission: EM | Admit: 2016-03-21 | Discharge: 2016-03-21 | Disposition: A | Discharge status: Home / Self Care | Payer: BLUE CROSS/BLUE SHIELD | Attending: Emergency Medicine | Admitting: Emergency Medicine

## 2016-03-21 DIAGNOSIS — I10 Essential (primary) hypertension: Secondary | ICD-10-CM | POA: Insufficient documentation

## 2016-03-21 DIAGNOSIS — R1310 Dysphagia, unspecified: Secondary | ICD-10-CM | POA: Diagnosis not present

## 2016-03-21 DIAGNOSIS — Z79899 Other long term (current) drug therapy: Secondary | ICD-10-CM | POA: Diagnosis not present

## 2016-03-21 DIAGNOSIS — J029 Acute pharyngitis, unspecified: Secondary | ICD-10-CM | POA: Diagnosis not present

## 2016-03-21 DIAGNOSIS — T189XXA Foreign body of alimentary tract, part unspecified, initial encounter: Secondary | ICD-10-CM | POA: Diagnosis not present

## 2016-03-21 DIAGNOSIS — Z87891 Personal history of nicotine dependence: Secondary | ICD-10-CM | POA: Diagnosis not present

## 2016-03-21 MED ORDER — GI COCKTAIL ~~LOC~~
30.0000 mL | Freq: Once | ORAL | Status: AC
  Administered 2016-03-21: 30 mL via ORAL
  Filled 2016-03-21: qty 30

## 2016-03-21 MED ORDER — MAGIC MOUTHWASH W/LIDOCAINE: 5.0000 mL | Freq: Four times a day (QID) | ORAL | 0 refills | Status: DC | PRN

## 2016-03-21 NOTE — ED Triage Notes (Signed)
Pt c/o possible fishbone stuck in throat since last night.  Pain score 8/10.  Pt reports that he has been drinking and eating bread to try to dislodge foreign body.  Pt able to maintain saliva and easily speak in full sentences.

## 2016-03-21 NOTE — ED Notes (Signed)
Pt ambulatory and independent at discharge.  Verbalized understanding of discharge instructions 

## 2016-03-21 NOTE — Discharge Instructions (Signed)
Your x-ray today did not show a bone in your throat. It is possible that the bone may be very small and hard to see on x-ray. Because you are able to swallow food and liquids, we recommend that you follow-up with Dr. Loreta AveMann in her office to further evaluate your pain with swallowing. You may benefit from drinking liquids and eating less solid foods as solid foods may cause you more discomfort. You may use Magic mouthwash as needed for pain. Return to the emergency department for any new or concerning symptoms.

## 2016-03-21 NOTE — ED Provider Notes (Signed)
[facility]-EMERGENCY DEPT Provider Note   CSN: [id number] Arrival date & time: 03/21/16  1609  By signing my name below, I, [name], attest that this documentation has been prepared under the direction and in the presence of TRW AutomotiveKelly Gabreal Worton, PA-C. Electronically Signed: Alyssa GroveMartin Rose, ED Scribe. 03/21/16. 9:29 PM.   History   Chief Complaint Chief Complaint  Patient presents with  . Foreign Body   The history is provided by the patient. No language interpreter was used.    HPI Comments: [name] is a 55 y.o. male who presents to the Emergency Department complaining of sensation of a foreign body lodged in his throat. Pt states he swallowed a small trout bone yesterday. Pt reports gradual worsening discomfort with swallowing. Pt has tried eating peanut butter, bread, and bananas to try to dislodge the bone with no relief to symptoms. Pt states this has happened before; he came to the ED and had the bone removed on 01/2010. Pt states the feeling is similar. Pt denies vomiting, inability to swallow, drooling, shortness of breath.   Past Medical History:  Diagnosis Date  . Hyperlipidemia   . Hypertension   . Hypogonadism male   . OSA (obstructive sleep apnea)   . Other testicular hypofunction   . Pre-diabetes   . Vitamin D deficiency disease     Patient Active Problem List   Diagnosis Date Noted  . Medication management 02/16/2015  . Noncompliance 02/16/2015  . Obesity 08/05/2014  . ED (erectile dysfunction) 08/05/2014  . Gout 08/05/2014  . Pre-diabetes   . Vitamin D deficiency disease   . Hypogonadism male   . OSA (obstructive sleep apnea)   . Essential hypertension 06/17/2013  . Mixed hyperlipidemia 06/17/2013    History reviewed. No pertinent surgical history.   Home Medications    Prior to Admission medications   Medication Sig Start Date End Date Taking? Authorizing Provider  hydrochlorothiazide (HYDRODIURIL) 25 MG tablet Take 1 tablet (25 mg total) by mouth  daily. 09/17/14  Yes Lucky CowboyWilliam McKeown, MD  losartan (COZAAR) 50 MG tablet TAKE 1 TABLET (50 MG TOTAL) BY MOUTH DAILY. 03/01/16  Yes Luis MullingAmanda Collier, PA-C  pantoprazole (PROTONIX) 40 MG tablet TAKE 1 TABLET BY MOUTH EVERY DAY 30 MINUTES BEFORE FOOD 12/07/15  Yes Lucky CowboyWilliam McKeown, MD  pravastatin (PRAVACHOL) 40 MG tablet TAKE 1 TABLET BY MOUTH EVERY DAY 11/28/15  Yes Luis MullingAmanda Collier, PA-C  predniSONE (DELTASONE) 20 MG tablet 2 tablets daily for 3 days, 1 tablet daily for 4 days. 03/16/16  Yes Luis MullingAmanda Collier, PA-C  B-D 3CC LUER-LOK SYR 22GX1" 22G X 1" 3 ML MISC USE AS DIRECTED 07/15/13   Loree FeeMelissa Smith, PA-C  lubiprostone (AMITIZA) 24 MCG capsule Take 1 capsule (24 mcg total) by mouth daily with breakfast. Patient not taking: Reported on 03/21/2016 12/21/15   Luis Piedraourtney Forcucci, PA-C  magic mouthwash w/lidocaine SOLN Take 5 mLs by mouth 4 (four) times daily as needed (for sore throat). 03/21/16   Luis MaduraKelly Oyinkansola Truax, PA-C  meclizine (ANTIVERT) 25 MG tablet 1/2-1 pill up to 3 times daily for motion sickness/dizziness Patient not taking: Reported on 03/21/2016 05/19/15   Luis MullingAmanda Collier, PA-C  nystatin cream (MYCOSTATIN) Apply 1 application topically 2 (two) times daily. To feet bilateral Patient not taking: Reported on 03/21/2016 08/05/14   Luis MullingAmanda Collier, PA-C  sildenafil (REVATIO) 20 MG tablet Take 3 to 5 tablets as need for ED Patient not taking: Reported on 03/21/2016 02/01/16   Luis Piedraourtney Forcucci, PA-C  testosterone cypionate (DEPOTESTOSTERONE CYPIONATE) 200 MG/ML injection  INJECT IM EVERY 2 WEEKS 02/16/15   Luis Mulling, PA-C  traZODone (DESYREL) 150 MG tablet Take 1/3 to 1/2 to 1 tablet 1 hour before sleep. Patient not taking: Reported on 03/21/2016 08/04/15   Lucky Cowboy, MD    Family History Family History  Problem Relation Age of Onset  . Diabetes Neg Hx   . Heart disease Neg Hx   . Hyperlipidemia Neg Hx   . Hypertension Neg Hx   . Stroke Neg Hx   . Cancer Neg Hx     Social History Social History    Substance Use Topics  . Smoking status: Former Smoker    Packs/day: 1.00    Quit date: 11/25/2010  . Smokeless tobacco: Never Used  . Alcohol use 0.0 oz/week     Allergies   Review of patient's allergies indicates no known allergies.   Review of Systems Review of Systems  HENT: Positive for sore throat. Negative for drooling and trouble swallowing.   Respiratory: Negative for shortness of breath.   10 Systems reviewed and are negative for acute change except as noted in the HPI.    Physical Exam Updated Vital Signs BP 178/92 (BP Location: Left Arm)   Pulse (!) 52   Temp 98.3 F (36.8 C) (Oral)   Resp 20   Ht 5\' 10"  (1.778 m)   Wt 115.7 kg   SpO2 99%   BMI 36.59 kg/m   Physical Exam  Constitutional: He is oriented to person, place, and time. He appears well-developed and well-nourished. No distress.  Nontoxic appearing and in no distress  HENT:  Head: Normocephalic and atraumatic.  Mallampati 3. Uvula midline. Unable to visualize posterior oropharynx further. No foreign body noted. Patient tolerating secretions without difficulty or drooling. No tripoding.  Eyes: Conjunctivae and EOM are normal. No scleral icterus.  Neck: Normal range of motion.  No stridor. No nuchal rigidity or meningismus  Cardiovascular: Normal rate, regular rhythm and intact distal pulses.   Pulmonary/Chest: Effort normal. No respiratory distress. He has no wheezes. He has no rales.  Respirations even and unlabored  Musculoskeletal: Normal range of motion.  Neurological: He is alert and oriented to person, place, and time.  GCS 15. Patient moving all extremities.  Skin: Skin is warm and dry. No rash noted. He is not diaphoretic. No erythema. No pallor.  Psychiatric: He has a normal mood and affect. His behavior is normal.  Nursing note and vitals reviewed.    ED Treatments / Results  DIAGNOSTIC STUDIES: Oxygen Saturation is 95% on RA, adequate by my interpretation.    COORDINATION OF  CARE: 9:23 PM Discussed treatment plan with pt at bedside which includes DG Neck Soft Tissue and pt agreed to plan.  Labs (all labs ordered are listed, but only abnormal results are displayed) Labs Reviewed - No data to display  EKG  EKG Interpretation None       Radiology Dg Neck Soft Tissue  Result Date: 03/21/2016 CLINICAL DATA:  Swallowed fish bone. Globus sensation. The patient states he can still feel the fish bone in his throat. EXAM: NECK SOFT TISSUES - 1+ VIEW COMPARISON:  One view soft tissue neck 01/24/2010 FINDINGS: The soft tissues the neck are unremarkable. No radiopaque foreign body is present. The airway is patent. The epiglottis is normal. Advanced degenerative changes are present in the cervical spine at C5-6 and C6-7 with uncovertebral spurring most prominent at C5-6. IMPRESSION: 1. No radiopaque foreign body or focal soft tissue injury. 2. 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 Electronically Signed   By: Marin Robertshristopher  Mattern M.D.   On: 03/21/2016 21:56    Procedures Procedures (including critical care time)  Medications Ordered in ED Medications  gi cocktail (Maalox,Lidocaine,Donnatal) (30 mLs Oral Given 03/21/16 2339)     Initial Impression / Assessment and Plan / ED Course  I have reviewed the triage vital signs and the nursing notes.  Pertinent labs & imaging results that were available during my care of the patient were reviewed by me and considered in my medical decision making (see chart for details).  Clinical Course    55 year old male presents to the emergency department for evaluation of dysphasia. He states that he believes that he may have a fishbone stuck in his throat after eating trout yesterday. Patient has been able to tolerate both solid foods and liquids without difficulty. He is tolerating his secretions during my exam and is in no acute distress. He has no stridor  or hypoxia. X-ray shows no definitive evidence of foreign body in the esophagus.  Given patient's ability to tolerate food and fluids and no signs of respiratory distress, I do not believe he warrants emergent endoscopy to evaluate for retained foreign body. The patient has been referred to gastroenterology for outpatient follow-up. Will prescribe Magic mouthwash for sore throat. Return precautions discussed and provided. Patient discharged in satisfactory condition with no unaddressed concerns. Patient case discussed also with my attending, Dr. Fayrene FearingJames, who is in agreement with this workup, assessment, management plan, and patient's stability for discharge.   Final Clinical Impressions(s) / ED Diagnoses   Final diagnoses:  Dysphagia    New Prescriptions Discharge Medication List as of 03/21/2016 11:20 PM    START taking these medications   Details  magic mouthwash w/lidocaine SOLN Take 5 mLs by mouth 4 (four) times daily as needed (for sore throat)., Starting Mon 03/21/2016, Print       I personally performed the services described in this documentation, which was scribed in my presence. The recorded information has been reviewed and is accurate.       Luis MaduraKelly Panagiota Perfetti, PA-C 03/22/16 0105    Rolland PorterMark James, MD 03/22/16 1155

## 2016-04-10 ENCOUNTER — Ambulatory Visit (HOSPITAL_COMMUNITY)
Admission: EM | Admit: 2016-04-10 | Discharge: 2016-04-10 | Disposition: A | Discharge status: Home / Self Care | Payer: BLUE CROSS/BLUE SHIELD | Attending: Otolaryngology | Admitting: Otolaryngology

## 2016-04-10 ENCOUNTER — Emergency Department (HOSPITAL_COMMUNITY): Payer: BLUE CROSS/BLUE SHIELD | Admitting: Anesthesiology

## 2016-04-10 ENCOUNTER — Encounter (HOSPITAL_COMMUNITY): Payer: Self-pay

## 2016-04-10 ENCOUNTER — Encounter (HOSPITAL_COMMUNITY)
Admission: EM | Disposition: A | Discharge status: Home / Self Care | Payer: Self-pay | Source: Home / Self Care | Attending: Emergency Medicine

## 2016-04-10 DIAGNOSIS — X58XXXA Exposure to other specified factors, initial encounter: Secondary | ICD-10-CM | POA: Insufficient documentation

## 2016-04-10 DIAGNOSIS — K229 Disease of esophagus, unspecified: Secondary | ICD-10-CM | POA: Diagnosis not present

## 2016-04-10 DIAGNOSIS — G4730 Sleep apnea, unspecified: Secondary | ICD-10-CM | POA: Insufficient documentation

## 2016-04-10 DIAGNOSIS — I10 Essential (primary) hypertension: Secondary | ICD-10-CM | POA: Insufficient documentation

## 2016-04-10 DIAGNOSIS — T18128A Food in esophagus causing other injury, initial encounter: Secondary | ICD-10-CM | POA: Insufficient documentation

## 2016-04-10 DIAGNOSIS — Z87891 Personal history of nicotine dependence: Secondary | ICD-10-CM | POA: Insufficient documentation

## 2016-04-10 DIAGNOSIS — R070 Pain in throat: Secondary | ICD-10-CM | POA: Diagnosis not present

## 2016-04-10 DIAGNOSIS — R1310 Dysphagia, unspecified: Secondary | ICD-10-CM | POA: Diagnosis not present

## 2016-04-10 DIAGNOSIS — Z6836 Body mass index (BMI) 36.0-36.9, adult: Secondary | ICD-10-CM | POA: Insufficient documentation

## 2016-04-10 DIAGNOSIS — E669 Obesity, unspecified: Secondary | ICD-10-CM | POA: Insufficient documentation

## 2016-04-10 DIAGNOSIS — F458 Other somatoform disorders: Secondary | ICD-10-CM | POA: Diagnosis not present

## 2016-04-10 HISTORY — PX: RIGID ESOPHAGOSCOPY: SHX5226

## 2016-04-10 SURGERY — ESOPHAGOSCOPY, RIGID
Anesthesia: General | Site: Esophagus

## 2016-04-10 MED ORDER — HYDROCODONE-ACETAMINOPHEN 7.5-325 MG PO TABS
ORAL_TABLET | ORAL | Status: AC
  Administered 2016-04-10: 1
  Filled 2016-04-10: qty 1

## 2016-04-10 MED ORDER — ONDANSETRON HCL 4 MG/2ML IJ SOLN
INTRAMUSCULAR | Status: DC | PRN
  Administered 2016-04-10: 4 mg via INTRAVENOUS

## 2016-04-10 MED ORDER — FENTANYL CITRATE (PF) 100 MCG/2ML IJ SOLN: 25.0000 ug | INTRAMUSCULAR | Status: DC | PRN

## 2016-04-10 MED ORDER — LIDOCAINE 2% (20 MG/ML) 5 ML SYRINGE
INTRAMUSCULAR | Status: AC
  Filled 2016-04-10: qty 5

## 2016-04-10 MED ORDER — LIDOCAINE 2% (20 MG/ML) 5 ML SYRINGE
INTRAMUSCULAR | Status: DC | PRN
  Administered 2016-04-10: 100 mg via INTRAVENOUS

## 2016-04-10 MED ORDER — BUTAMBEN-TETRACAINE-BENZOCAINE 2-2-14 % EX AERO
1.0000 | INHALATION_SPRAY | Freq: Once | CUTANEOUS | Status: AC
  Administered 2016-04-10: 1 via TOPICAL

## 2016-04-10 MED ORDER — PROPOFOL 10 MG/ML IV BOLUS
INTRAVENOUS | Status: AC
  Filled 2016-04-10: qty 20

## 2016-04-10 MED ORDER — ONDANSETRON HCL 4 MG/2ML IJ SOLN
INTRAMUSCULAR | Status: AC
  Filled 2016-04-10: qty 2

## 2016-04-10 MED ORDER — SUCCINYLCHOLINE CHLORIDE 20 MG/ML IJ SOLN
INTRAMUSCULAR | Status: DC | PRN
  Administered 2016-04-10: 100 mg via INTRAVENOUS

## 2016-04-10 MED ORDER — PROPOFOL 10 MG/ML IV BOLUS
INTRAVENOUS | Status: DC | PRN
  Administered 2016-04-10 (×3): 50 mg via INTRAVENOUS
  Administered 2016-04-10: 200 mg via INTRAVENOUS

## 2016-04-10 MED ORDER — LACTATED RINGERS IV SOLN
INTRAVENOUS | Status: DC | PRN
  Administered 2016-04-10: 17:00:00 via INTRAVENOUS

## 2016-04-10 MED ORDER — SUCCINYLCHOLINE CHLORIDE 200 MG/10ML IV SOSY
PREFILLED_SYRINGE | INTRAVENOUS | Status: AC
  Filled 2016-04-10: qty 10

## 2016-04-10 MED ORDER — HYDROCODONE-ACETAMINOPHEN 7.5-325 MG PO TABS: 1.0000 | ORAL_TABLET | Freq: Once | ORAL | Status: DC | PRN

## 2016-04-10 MED ORDER — ONDANSETRON HCL 4 MG/2ML IJ SOLN: 4.0000 mg | Freq: Once | INTRAMUSCULAR | Status: DC | PRN

## 2016-04-10 SURGICAL SUPPLY — 23 items
BLADE SURG 15 STRL LF DISP TIS (BLADE) IMPLANT
BLADE SURG 15 STRL SS (BLADE)
CANISTER SUCTION 2500CC (MISCELLANEOUS) ×2 IMPLANT
COVER TABLE BACK 60X90 (DRAPES) ×2 IMPLANT
CRADLE DONUT ADULT HEAD (MISCELLANEOUS) IMPLANT
DRAPE PROXIMA HALF (DRAPES) ×2 IMPLANT
GAUZE SPONGE 4X4 12PLY STRL (GAUZE/BANDAGES/DRESSINGS) ×1 IMPLANT
GAUZE SPONGE 4X4 16PLY XRAY LF (GAUZE/BANDAGES/DRESSINGS) ×1 IMPLANT
GLOVE BIO SURGEON STRL SZ7.5 (GLOVE) ×2 IMPLANT
GOWN STRL REUS W/ TWL LRG LVL3 (GOWN DISPOSABLE) ×2 IMPLANT
GOWN STRL REUS W/TWL LRG LVL3 (GOWN DISPOSABLE) ×2
KIT MARKER MARGIN INK (KITS) ×1 IMPLANT
KIT ROOM TURNOVER OR (KITS) ×2 IMPLANT
MARKER SKIN DUAL TIP RULER LAB (MISCELLANEOUS) IMPLANT
NDL HYPO 25GX1X1/2 BEV (NEEDLE) IMPLANT
NEEDLE HYPO 25GX1X1/2 BEV (NEEDLE) IMPLANT
NS IRRIG 1000ML POUR BTL (IV SOLUTION) ×2 IMPLANT
PAD ARMBOARD 7.5X6 YLW CONV (MISCELLANEOUS) ×3 IMPLANT
PATTIES SURGICAL .5 X3 (DISPOSABLE) IMPLANT
SOLUTION ANTI FOG 6CC (MISCELLANEOUS) ×1 IMPLANT
SURGILUBE 2OZ TUBE FLIPTOP (MISCELLANEOUS) ×2 IMPLANT
TOWEL OR 17X24 6PK STRL BLUE (TOWEL DISPOSABLE) ×3 IMPLANT
TUBE CONNECTING 12X1/4 (SUCTIONS) ×2 IMPLANT

## 2016-04-10 NOTE — Procedures (Signed)
Preop diagnosis: Pharyngeal foreign body Postop diagnosis: same Procedure: Transnasal fiberoptic laryngoscopy Surgeon: Jenne PaneBates Anesth: Topical with 4% lidocaine and Neo-synephrine Compl: None Findings: No pharyngeal foreign body identified.  Pharynx and larynx without abnormality. Description: After discussing risks, benefits, and alternatives, the patient was placed in a seated position.  The nasal passages were sprayed with topical lidocaine and Neo-synephrine.  The flexible laryngoscope was then passed through the right nasal passage to visualize the pharynx and larynx.  Once completed, the scope was removed and he was returned to nursing care in stable condition. Plan: No pharyngeal foreign body was identified.  I discussed options with the patient of observation versus further evaluation with rigid esophagoscopy.  With his history, he wishes to proceed with further evaluation.

## 2016-04-10 NOTE — ED Provider Notes (Signed)
Complains of fish bone stuck in his throat since this last night. Patient is alert, no distress handling secretions well. Procedure I performed direct laryngoscopy on patient after timeout performed. Throat numbed with Cetacaine spray I looked with #3 curved blade. Poor visualization due body habitus and inability of patient to cooperate. Dr. Jenne PaneBates consulted and will evaluate patient in the ED   Doug SouSam Raphaela Cannaday, MD 04/10/16 1428

## 2016-04-10 NOTE — Transfer of Care (Signed)
Immediate Anesthesia Transfer of Care Note  Patient: [name]  Procedure(s) Performed: Procedure(s): RIGID ESOPHAGOSCOPY (N/A)  Patient Location: PACU  Anesthesia Type:General  Level of Consciousness: awake  Airway & Oxygen Therapy: Patient Spontanous Breathing  Post-op Assessment: Report given to RN and Post -op Vital signs reviewed and stable  Post vital signs: Reviewed and stable  Last Vitals:  Vitals:   04/10/16 1445 04/10/16 1800  BP: 130/84   Pulse: (!) 58   Resp:    Temp:  37 C    Last Pain:  Vitals:   04/10/16 1131  TempSrc: Oral  PainSc:          Complications: No apparent anesthesia complications

## 2016-04-10 NOTE — ED Provider Notes (Signed)
[facility]Y DEPT Provider Note   CSN: [id number] Arrival date & time: 04/10/16  1125     History   Chief Complaint No chief complaint on file.   HPI [name] is a 55 y.o. male.  HPI   55 year old male with history of hypertension, hyperlipidemia, OSA presenting with complaints of sensation of foreign body lodged in his throat. Patient reports he was eating fish fillet, Trout last night. After eating he felt a discomfort in his throat similar to prior fish bone that was stuck in his throat in the past.  Report discomfort is mild to moderate, more prominent whenever he swallow.  He was having difficulty sleeping last night due to his discomfort.  He tries swallowing bread to help it pass without success.  Sxs still persists.  Pt denies fever, chills, nausea, vomiting, hemoptysis, hematemesis, cp, sob, abd pain.  Sts that he has a fishbone that was stuck in his throat requiring scoping by specialist to remove it.  He was also seen on 08/14 for similar complaint, but xray did not show anything.  He is concern that the previous fishbone is still in his esophagus.  He is requesting to be scoped.    Past Medical History:  Diagnosis Date  . Hyperlipidemia   . Hypertension   . Hypogonadism male   . OSA (obstructive sleep apnea)   . Other testicular hypofunction   . Pre-diabetes   . Vitamin D deficiency disease     Patient Active Problem List   Diagnosis Date Noted  . Medication management 02/16/2015  . Noncompliance 02/16/2015  . Obesity 08/05/2014  . ED (erectile dysfunction) 08/05/2014  . Gout 08/05/2014  . Pre-diabetes   . Vitamin D deficiency disease   . Hypogonadism male   . OSA (obstructive sleep apnea)   . Essential hypertension 06/17/2013  . Mixed hyperlipidemia 06/17/2013    History reviewed. No pertinent surgical history.     Home Medications    Prior to Admission medications   Medication Sig Start Date End Date Taking? Authorizing Provider  B-D 3CC  LUER-LOK SYR 22GX1" 22G X 1" 3 ML MISC USE AS DIRECTED 07/15/13   Loree FeeMelissa Smith, PA-C  hydrochlorothiazide (HYDRODIURIL) 25 MG tablet Take 1 tablet (25 mg total) by mouth daily. 09/17/14   Lucky CowboyWilliam McKeown, MD  losartan (COZAAR) 50 MG tablet TAKE 1 TABLET (50 MG TOTAL) BY MOUTH DAILY. 03/01/16   Quentin MullingAmanda Collier, PA-C  lubiprostone (AMITIZA) 24 MCG capsule Take 1 capsule (24 mcg total) by mouth daily with breakfast. Patient not taking: Reported on 03/21/2016 12/21/15   Terri Piedraourtney Forcucci, PA-C  magic mouthwash w/lidocaine SOLN Take 5 mLs by mouth 4 (four) times daily as needed (for sore throat). 03/21/16   Antony MaduraKelly Humes, PA-C  meclizine (ANTIVERT) 25 MG tablet 1/2-1 pill up to 3 times daily for motion sickness/dizziness Patient not taking: Reported on 03/21/2016 05/19/15   Quentin MullingAmanda Collier, PA-C  nystatin cream (MYCOSTATIN) Apply 1 application topically 2 (two) times daily. To feet bilateral Patient not taking: Reported on 03/21/2016 08/05/14   Quentin MullingAmanda Collier, PA-C  pantoprazole (PROTONIX) 40 MG tablet TAKE 1 TABLET BY MOUTH EVERY DAY 30 MINUTES BEFORE FOOD 12/07/15   Lucky CowboyWilliam McKeown, MD  pravastatin (PRAVACHOL) 40 MG tablet TAKE 1 TABLET BY MOUTH EVERY DAY 11/28/15   Quentin MullingAmanda Collier, PA-C  predniSONE (DELTASONE) 20 MG tablet 2 tablets daily for 3 days, 1 tablet daily for 4 days. 03/16/16   Quentin MullingAmanda Collier, PA-C  sildenafil (REVATIO) 20 MG tablet Take 3 to  5 tablets as need for ED Patient not taking: Reported on 03/21/2016 02/01/16   Terri Piedra, PA-C  testosterone cypionate (DEPOTESTOSTERONE CYPIONATE) 200 MG/ML injection INJECT IM EVERY 2 WEEKS 02/16/15   Quentin Mulling, PA-C  traZODone (DESYREL) 150 MG tablet Take 1/3 to 1/2 to 1 tablet 1 hour before sleep. Patient not taking: Reported on 03/21/2016 08/04/15   Lucky Cowboy, MD    Family History Family History  Problem Relation Age of Onset  . Diabetes Neg Hx   . Heart disease Neg Hx   . Hyperlipidemia Neg Hx   . Hypertension Neg Hx   . Stroke Neg Hx    . Cancer Neg Hx     Social History Social History  Substance Use Topics  . Smoking status: Former Smoker    Packs/day: 1.00    Quit date: 11/25/2010  . Smokeless tobacco: Never Used  . Alcohol use 0.0 oz/week     Allergies   Review of patient's allergies indicates no known allergies.   Review of Systems Review of Systems  Constitutional: Negative for fever.  HENT: Positive for sore throat. Negative for dental problem, drooling, mouth sores, trouble swallowing and voice change.   Respiratory: Negative for shortness of breath.   Cardiovascular: Negative for chest pain.  Gastrointestinal: Negative for abdominal pain.  Skin: Negative for wound.  Neurological: Negative for headaches.     Physical Exam Updated Vital Signs BP 140/83 (BP Location: Right Arm)   Pulse 77   Temp 98.5 F (36.9 C) (Oral)   Resp 18   Ht 5\' 10"  (1.778 m)   Wt 115.7 kg   SpO2 99%   BMI 36.59 kg/m   Physical Exam  Constitutional: He appears well-developed and well-nourished. No distress.  The patient laying in bed resting comfortably in no acute discomfort.  HENT:  Head: Atraumatic.  Mouth/Throat: Oropharynx is clear and moist.  Eyes: Conjunctivae are normal.  Neck: Normal range of motion. Neck supple. No thyromegaly present.  Cardiovascular: Normal rate and regular rhythm.   Pulmonary/Chest: Effort normal and breath sounds normal. No stridor.  Abdominal: Soft. There is no tenderness.  Neurological: He is alert.  Skin: No rash noted.  Psychiatric: He has a normal mood and affect.  Nursing note and vitals reviewed.    ED Treatments / Results  Labs (all labs ordered are listed, but only abnormal results are displayed) Labs Reviewed - No data to display  EKG  EKG Interpretation None       Radiology No results found.  Procedures Procedures (including critical care time)  Medications Ordered in ED Medications  butamben-tetracaine-benzocaine (CETACAINE) spray 1 spray (1  spray Topical Given 04/10/16 1302)     Initial Impression / Assessment and Plan / ED Course  I have reviewed the triage vital signs and the nursing notes.  Pertinent labs & imaging results that were available during my care of the patient were reviewed by me and considered in my medical decision making (see chart for details).  Clinical Course    BP 130/84   Pulse (!) 58   Temp 98.5 F (36.9 C) (Oral)   Resp 18   Ht 5\' 10"  (1.778 m)   Wt 115.7 kg   SpO2 100%   BMI 36.59 kg/m    Final Clinical Impressions(s) / ED Diagnoses   Final diagnoses:  Dysphagia    New Prescriptions New Prescriptions   No medications on file   1:02 PM Patient here with throat discomfort and concern for  potential lodged bone fish in his esophagus since last night.  Pt has been seen several times in the past for similar complaints.  There is a note from [facility] 2002 by ENT DR. Lovey Newcomer who performed laryngoscope and able to extra a 2.5cm bone fish that was imginging in the left inferior base of tongue, lateral pharyngeal wall area.  Care discussed with Dr. Ethelda Chick, plan to perform direct laryngoscopy in the ER.    1:36 PM Dr. Ethelda Chick attempted to scope pt's throat using cetacaine spray and a mac #3 laryngoscope but unable to visualize much due to pt's discomfort.  Will consult ENT for further care.   2:23 PM ENT Dr. Jenne Pane has been consulted and will see pt in the ER.    3:14 PM Care discussed with oncoming provider who will dispo pt pending ENT evaluation.    3:30 PM Dr. Jenne Pane performed a bedside laryngoscopy but unable to identify any fb.  Pt request to have a formal scope, therefore pt will be taken to the OR for a formal laryngoscopy.     Fayrene Helper, PA-C 04/10/16 1532    Doug Sou, MD 04/10/16 [phone]

## 2016-04-10 NOTE — Anesthesia Procedure Notes (Signed)
Procedure Name: Intubation Date/Time: 04/10/2016 5:30 PM Performed by: Alanda AmassFRIEDMAN, Kaelei Wheeler A Pre-anesthesia Checklist: Patient identified, Emergency Drugs available, Suction available, Patient being monitored and Timeout performed Patient Re-evaluated:Patient Re-evaluated prior to inductionOxygen Delivery Method: Circle System Utilized and Circle system utilized Preoxygenation: Pre-oxygenation with 100% oxygen Intubation Type: IV induction Laryngoscope Size: Mac and 3 Grade View: Grade III Tube type: Oral Number of attempts: 1 Airway Equipment and Method: Stylet Placement Confirmation: ETT inserted through vocal cords under direct vision,  positive ETCO2 and breath sounds checked- equal and bilateral Secured at: 24 cm Tube secured with: Tape Dental Injury: Teeth and Oropharynx as per pre-operative assessment

## 2016-04-10 NOTE — ED Notes (Signed)
Dr. Bates at bedside 

## 2016-04-10 NOTE — ED Notes (Signed)
Updated pt. On plan of care per Primary RN Brain, Pt. Is going to the OR>  Pt. And his wife verbalized understanding

## 2016-04-10 NOTE — Anesthesia Preprocedure Evaluation (Signed)
Anesthesia Evaluation  Patient identified by MRN, date of birth, ID band Patient awake    Reviewed: Allergy & Precautions, H&P , NPO status , Patient's Chart, lab work & pertinent test results  History of Anesthesia Complications Negative for: history of anesthetic complications  Airway Mallampati: II  TM Distance: >3 FB Neck ROM: full    Dental no notable dental hx.    Pulmonary sleep apnea , former smoker,    Pulmonary exam normal breath sounds clear to auscultation       Cardiovascular hypertension, Normal cardiovascular exam Rhythm:regular Rate:Normal     Neuro/Psych negative neurological ROS     GI/Hepatic negative GI ROS, Neg liver ROS,   Endo/Other  obesity  Renal/GU negative Renal ROS     Musculoskeletal   Abdominal   Peds  Hematology negative hematology ROS (+)   Anesthesia Other Findings   Reproductive/Obstetrics negative OB ROS                             Anesthesia Physical Anesthesia Plan  ASA: III  Anesthesia Plan: General   Post-op Pain Management:    Induction: Intravenous  Airway Management Planned: Oral ETT  Additional Equipment:   Intra-op Plan:   Post-operative Plan:   Informed Consent: I have reviewed the patients History and Physical, chart, labs and discussed the procedure including the risks, benefits and alternatives for the proposed anesthesia with the patient or authorized representative who has indicated his/her understanding and acceptance.   Dental Advisory Given  Plan Discussed with: Anesthesiologist, CRNA and Surgeon  Anesthesia Plan Comments: (TIVA vs. ETT with volatile for anesthesia based on location of foreign body)        Anesthesia Quick Evaluation

## 2016-04-10 NOTE — ED Notes (Signed)
Notified Dr. Jenne PaneBates that materials are ready for procedure. MD on his way. Family notified.

## 2016-04-10 NOTE — ED Notes (Signed)
Obtained cup forceps, ENT cart, and light source for Dr. Jenne PaneBates. Called MD to notify pt is ready for procedure. No response on first page.

## 2016-04-10 NOTE — ED Triage Notes (Signed)
Patient here with fishbone stuck in throat since last pm, state that he cant get it to pass. Speaking complete sentences, no airway compromise. Alert and oriented

## 2016-04-10 NOTE — Brief Op Note (Signed)
04/10/2016  5:46 PM  PATIENT:  [name]  55 y.o. male  PRE-OPERATIVE DIAGNOSIS:  esophageal foreign body  POST-OPERATIVE DIAGNOSIS:  esophageal foreign body  PROCEDURE:  Procedure(s): RIGID ESOPHAGOSCOPY (N/A)  SURGEON:  Surgeon(s) and Role:    * Christia Readingwight Valborg Friar, MD - Primary  PHYSICIAN ASSISTANT:   ASSISTANTS: none   ANESTHESIA:   general  EBL:  No intake/output data recorded.  BLOOD ADMINISTERED:none  DRAINS: none   LOCAL MEDICATIONS USED:  NONE  SPECIMEN:  No Specimen  DISPOSITION OF SPECIMEN:  N/A  COUNTS:  YES  TOURNIQUET:  * No tourniquets in log *  DICTATION: .Other Dictation: Dictation Number [id number]  PLAN OF CARE: Discharge to home after PACU  PATIENT DISPOSITION:  PACU - hemodynamically stable.   Delay start of Pharmacological VTE agent (>24hrs) due to surgical blood loss or risk of bleeding: no

## 2016-04-10 NOTE — Consult Note (Signed)
Reason for Consult: Pharyngeal foreign body Referring Physician: ER  [name] is an 55 y.o. male.  HPI: 55 year old with OSA felt a fishbone lodge in his right lower throat last night.  He has been unable to clear the feeling with swallowing various foods and drinks and came to the ER.  He required removal of a pharyngeal fishbone by Dr. Dorma RussellKraus in 2002.  He also had the same feeling in mid-August and came to ER where an x-ray was negative and the feeling later improved without intervention.  Past Medical History:  Diagnosis Date  . Hyperlipidemia   . Hypertension   . Hypogonadism male   . OSA (obstructive sleep apnea)   . Other testicular hypofunction   . Pre-diabetes   . Vitamin D deficiency disease     History reviewed. No pertinent surgical history.  Family History  Problem Relation Age of Onset  . Diabetes Neg Hx   . Heart disease Neg Hx   . Hyperlipidemia Neg Hx   . Hypertension Neg Hx   . Stroke Neg Hx   . Cancer Neg Hx     Social History:  reports that he quit smoking about 5 years ago. He smoked 1.00 pack per day. He has never used smokeless tobacco. He reports that he drinks alcohol. He reports that he does not use drugs.  Allergies: No Known Allergies  Medications: Prior to Admission:  (Not in a hospital admission)  No results found for this or any previous visit (from the past 48 hour(s)).  No results found.  Review of Systems  HENT: Positive for sore throat.   All other systems reviewed and are negative.  Blood pressure 142/91, pulse (!) 59, temperature 98.5 F (36.9 C), temperature source Oral, resp. rate 18, height 5\' 10"  (1.778 m), weight 115.7 kg (255 lb), SpO2 100 %. Physical Exam  Constitutional: He is oriented to person, place, and time. He appears well-developed and well-nourished. No distress.  HENT:  Head: Normocephalic and atraumatic.  Right Ear: External ear normal.  Left Ear: External ear normal.  Nose: Nose normal.  Mouth/Throat:  Oropharynx is clear and moist.  Eyes: Conjunctivae and EOM are normal. Pupils are equal, round, and reactive to light.  Points to right lower throat as location of foreign body.  Neck: Normal range of motion. Neck supple.  Cardiovascular: Normal rate.   Respiratory: Effort normal.  Musculoskeletal: Normal range of motion.  Neurological: He is alert and oriented to person, place, and time. No cranial nerve deficit.  Skin: Skin is warm and dry.  Psychiatric: He has a normal mood and affect. His behavior is normal. Judgment and thought content normal.    Assessment/Plan: Pharyngeal foreign body I will evaluate the pharynx and larynx with flexible bronchoscope and will be prepared to try to remove the fishbone with cup forceps, if present.  If unsuccessful, direct laryngoscopy in the OR will be considered.  [name] 04/10/2016, 2:51 PM

## 2016-04-10 NOTE — ED Notes (Signed)
ENT cart at bedside with McGill forceps, and Mac #3

## 2016-04-10 NOTE — Anesthesia Postprocedure Evaluation (Signed)
Anesthesia Post Note  Patient: Luis PringleMichael E Leikam  Procedure(s) Performed: Procedure(s) (LRB): RIGID ESOPHAGOSCOPY (N/A)  Patient location during evaluation: PACU Anesthesia Type: General Level of consciousness: awake and alert Pain management: pain level controlled Vital Signs Assessment: post-procedure vital signs reviewed and stable Respiratory status: spontaneous breathing, nonlabored ventilation, respiratory function stable and patient connected to nasal cannula oxygen Cardiovascular status: blood pressure returned to baseline and stable Postop Assessment: no signs of nausea or vomiting Anesthetic complications: no    Last Vitals:  Vitals:   04/10/16 1445 04/10/16 1800  BP: 130/84   Pulse: (!) 58   Resp:    Temp:  37 C    Last Pain:  Vitals:   04/10/16 1800  TempSrc:   PainSc: 3                  Reino KentJudd, Dandy Lazaro J

## 2016-04-11 ENCOUNTER — Encounter (HOSPITAL_COMMUNITY): Payer: Self-pay | Admitting: Otolaryngology

## 2016-04-11 ENCOUNTER — Encounter: Payer: Self-pay | Admitting: *Deleted

## 2016-04-11 NOTE — Op Note (Signed)
NAMLeavy Cella:  [name], [name]                ACCOUNT NO.:  [id number]  MEDICAL RECORD NO.:  [id number]  LOCATION:  [facility]                         FACILITY:  [facility]  PHYSICIAN:  Antony Contraswight D Asaph Serena, MD     DATE OF BIRTH:  Jun 13, 1961  DATE OF PROCEDURE:  04/10/2016 DATE OF DISCHARGE:  04/10/2016                              OPERATIVE REPORT   PREOPERATIVE DIAGNOSIS:  Esophageal foreign body.  POSTOPERATIVE DIAGNOSIS:  Esophageal foreign body.  PROCEDURE:  Rigid esophagoscopy.  SURGEON:  Excell SeltzerDwight D. Jenne PaneBates, MD  ANESTHESIA:  General endotracheal anesthesia.  COMPLICATIONS:  None.  INDICATION:  The patient is a 55 year old male who presented to the emergency department today having felt like he had a fishbone stuck in his throat since last night.  Fiberoptic laryngoscopy in the emergency department did not reveal a foreign body.  We discussed options and he wished to proceed with rigid esophagoscopy to rule out possibility of esophageal foreign body as the feeling was very low in the throat. Thus, he presents to the operating room for surgical management.  FINDINGS:  Examination of the pharynx and larynx were unremarkable as was examination of the cervical esophagus.  No foreign body was seen.  DESCRIPTION OF PROCEDURE:  The patient was identified in the holding room, informed consent having been obtained including discussion of risks, benefits, alternatives, the patient was brought to the operative suite and put on the operative table in supine position.  Anesthesia was induced.  The patient was intubated by the Anesthesia team without difficulty.  The eyes taped closed and bed was turned 90 degrees from anesthesia.  A tooth guard was placed over the upper teeth.  An anterior commissure laryngoscope was 1st inserted to view the various series of the pharynx including the piriform sinus and post cricoid region.  This was then removed and a cervical esophagoscope was passed through the mouth into  the upper esophagus keeping the lumen carefully in view while advancing to the midesophagus.  It was then carefully and slowly backed out until examining the esophagus carefully with suction was ultimately removed.  There was no foreign bodies seen or other gross abnormality.  The tooth guard was removed and patient was turned back to Anesthesia for wake up was extubated, moved to recovery room in stable condition.     Antony Contraswight D Edie Vallandingham, MD     DDB/MEDQ  D:  04/10/2016  T:  04/11/2016  Job:  [id number]

## 2016-04-12 ENCOUNTER — Encounter (HOSPITAL_COMMUNITY): Payer: Self-pay | Admitting: Otolaryngology

## 2016-04-27 ENCOUNTER — Encounter: Payer: Self-pay | Admitting: Physician Assistant

## 2016-04-27 NOTE — Progress Notes (Deleted)
Complete Physical  Assessment and Plan: 1. Essential hypertension - continue medications, DASH diet, exercise and monitor at home. Call if greater than 130/80.  - CBC with Differential/Platelet - BASIC METABOLIC PANEL WITH GFR - Hepatic function panel - TSH - Urinalysis, Routine w reflex microscopic (not at [facility]) - Microalbumin / creatinine urine ratio - EKG 12-Lead - [place], RETROPERITNL ABD,  LTD  2. Pre-diabetes Discussed general issues about diabetes pathophysiology and management., Educational material distributed., Suggested low cholesterol diet., Encouraged aerobic exercise., Discussed foot care., Reminded to get yearly retinal exam. - Hemoglobin A1c - Insulin, fasting - LOW EXTREMITY NEUR EXAM DOCUM  3. Mixed hyperlipidemia -continue medications, check lipids, decrease fatty foods, increase activity.  - Lipid panel  4. Obesity Obesity with co morbidities- long discussion about weight loss, diet, and exercise, will start the patient on phentermine- hand out given and AE's discussed, will do close follow up.   5. Hypogonadism male Hypogonadism- continue replacement therapy, check testosterone levels as needed, will NOT check today since had shot yesterday  6. Vitamin D deficiency disease - Vit D  25 hydroxy (rtn osteoporosis monitoring)  7. Medication management - Magnesium  8. Gout without tophus, unspecified cause, unspecified chronicity, unspecified site Get and STAY on allopurinol - Uric acid  9. Erectile dysfunction, unspecified erectile dysfunction type viagra printed out with 50% off coupon  10. OSA (obstructive sleep apnea) Sleep apnea- get on CPAP and given Dr. Merwyn Katos numbers, weight loss advised.   11. Noncompliance Noncompliance- emphasized compliance with medications and appointments, patient expressed understanding  12. Prostate cancer screening - PSA  13. Screening for viral disease - HIV antibody   Discussed med's effects and SE's.  Screening labs and tests as requested with regular follow-up as recommended.  HPI 55 y.o. AA  male  presents for a complete physical. 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 He denies chest pain, shortness of breath, dizziness.  He is on cholesterol medication, pravastatin 40 and denies myalgias. His cholesterol is at goal. The cholesterol last visit was:  Lab Results  Component Value Date   CHOL 187 02/16/2015   HDL 59 02/16/2015   LDLCALC 92 02/16/2015   TRIG 178 (H) 02/16/2015   CHOLHDL 3.2 02/16/2015   He has been working on diet and exercise for prediabetes, and denies paresthesia of the feet, polydipsia and polyuria. Last A1C in the office was:  Lab Results  Component Value Date   HGBA1C 5.5 02/16/2015   Patient is on Vitamin D supplement, 5000 IU a day but has not been taking it. Lab Results  Component Value Date   VD25OH 71 (L) 02/16/2015   He is has a history of testosterone deficiency and subsequent ED and is on testosterone replacement, his shots were changed to 2 cc q 3 weeks, yesterday was last injection but he is currently out of his medications. He states that the testosterone helps with his energy, libido, muscle mass. Lab Results  Component Value Date   TESTOSTERONE 341 01/23/2014   Patient is not on allopurinol for gout and does not report a recent flare.  Lab Results  Component Value Date   LABURIC 6.8 03/16/2016   He has a diagnosis of sleep apnea, last study in 2009 but he has tried multiple attempts to wear a CPAP and  difference masks but is unable to tolerate it.  BMI is There is no height or weight on file to calculate BMI., he is working on diet and exercise. Wt Readings from Last 3 Encounters:  04/10/16 255 lb (115.7 kg)  03/21/16 255 lb (115.7 kg)  03/16/16 255 lb (115.7 kg)   Lab Results   Component Value Date   PSA 0.41 02/16/2015   PSA 0.80 01/23/2014   Current Medications:  Current Outpatient Prescriptions on File Prior to Visit  Medication Sig Dispense Refill  . B-D 3CC LUER-LOK SYR 22GX1" 22G X 1" 3 ML MISC USE AS DIRECTED 100 each 3  . losartan (COZAAR) 50 MG tablet TAKE 1 TABLET (50 MG TOTAL) BY MOUTH DAILY. 90 tablet 1  . pantoprazole (PROTONIX) 40 MG tablet TAKE 1 TABLET BY MOUTH EVERY DAY 30 MINUTES BEFORE FOOD 90 tablet 2  . pravastatin (PRAVACHOL) 40 MG tablet TAKE 1 TABLET BY MOUTH EVERY DAY 90 tablet 0  . testosterone cypionate (DEPOTESTOSTERONE CYPIONATE) 200 MG/ML injection INJECT IM EVERY 2 WEEKS 10 mL 5   No current facility-administered medications on file prior to visit.    Health Maintenance:  Immunization History  Administered Date(s) Administered  . Tdap 01/23/2014   Tetanus: 2015 Pneumovax: N/A Prevnar 13: Due at age 58 Flu vaccine: N/A Zostavax: N/A DEXA: N/A Colonoscopy: 2007 and he had several polyps EGD: 01/2010 Dr. Loreta Ave + GERD CXR 01/2014 Ct chest 2015 normal CT  MRI brain/neck 2006 US scrotum 2011  Allergies: No Known Allergies Medical History:  Past Medical History:  Diagnosis Date  . Hyperlipidemia   . Hypertension   . Hypogonadism male   . OSA (obstructive sleep apnea)   . Other testicular hypofunction   . Pre-diabetes   . Vitamin D deficiency disease    Surgical History:  Past Surgical History:  Procedure Laterality Date  . RIGID ESOPHAGOSCOPY N/A 04/10/2016   Procedure: RIGID ESOPHAGOSCOPY;  Surgeon: Christia Reading, MD;  Location: [facility];  Service: ENT;  Laterality: N/A;   Family History:  Family History  Problem Relation Age of Onset  . Diabetes Neg Hx   . Heart disease Neg Hx   . Hyperlipidemia Neg Hx   . Hypertension Neg Hx   . Stroke Neg Hx   . Cancer Neg Hx    Social History:   Social History  Substance Use Topics  . Smoking status: Former Smoker    Packs/day: 1.00    Quit date: 11/25/2010  .  Smokeless tobacco: Never Used  . Alcohol use 0.0 oz/week   Review of Systems  Constitutional: Positive for malaise/fatigue. Negative for chills, diaphoresis, fever and weight loss.  HENT: Negative.   Respiratory: Negative.   Cardiovascular: Negative.   Gastrointestinal: Negative.   Genitourinary: Negative.        + ED  Musculoskeletal: Positive for joint pain (right foot, better with insert). Negative for back pain, falls, myalgias and neck pain.  Neurological: Negative.  Negative for weakness.  Psychiatric/Behavioral: Negative.     Physical Exam: Estimated body mass index is 36.59 kg/m as calculated from the following:   Height as of 04/10/16: 5\' 10"  (1.778 m).   Weight as of 04/10/16: 255 lb (115.7 kg). There were no vitals taken for this visit. General Appearance: Well nourished, overweight, in no apparent distress. Eyes: PERRLA, EOMs, conjunctiva no swelling or erythema, normal fundi and vessels. Sinuses: No Frontal/maxillary tenderness ENT/Mouth: Ext aud canals clear, normal light reflex with TMs without erythema, bulging. Good dentition.  Very crowded mouth and very difficult to visualize post pharynx.  Hearing normal.  Neck: Supple, thyroid normal. No bruits Respiratory: Respiratory effort normal, BS equal bilaterally without rales, rhonchi, wheezing or stridor. Cardio: RRR without murmurs, rubs or gallops. Brisk peripheral pulses with 1 +edema with varicose veins.  Chest: symmetric, with normal excursions and percussion. Abdomen: Soft, +BS, rotund, Non tender, no guarding, rebound, hernias, masses, or organomegaly. .  Lymphatics: Non tender without lymphadenopathy.  Genitourinary: defer Musculoskeletal: Full ROM all peripheral extremities,5/5 strength, and normal gait. Skin: Warm, dry without rashes, lesions, ecchymosis.  Neuro: Cranial nerves intact, reflexes equal bilaterally. Normal muscle tone, no cerebellar symptoms. Sensation intact.  Psych: Awake and oriented X 3, normal  affect, Insight and Judgment appropriate.   EKG: WNL, questionable LAE, no ST changes. AORTA SCAN: WNL  Quentin MullingAmanda Natayla Cadenhead 11:49 AM

## 2016-06-06 ENCOUNTER — Other Ambulatory Visit: Payer: Self-pay | Admitting: Physician Assistant

## 2016-06-21 ENCOUNTER — Encounter: Payer: Self-pay | Admitting: Internal Medicine

## 2016-07-26 ENCOUNTER — Encounter: Payer: Self-pay | Admitting: Internal Medicine

## 2016-08-10 ENCOUNTER — Encounter: Payer: Self-pay | Admitting: Internal Medicine

## 2016-08-10 ENCOUNTER — Ambulatory Visit (INDEPENDENT_AMBULATORY_CARE_PROVIDER_SITE_OTHER): Payer: BLUE CROSS/BLUE SHIELD | Admitting: Internal Medicine

## 2016-08-10 VITALS — BP 138/82 | HR 74 | Temp 98.0°F | Resp 18 | Ht 70.0 in | Wt 252.0 lb

## 2016-08-10 DIAGNOSIS — E349 Endocrine disorder, unspecified: Secondary | ICD-10-CM

## 2016-08-10 DIAGNOSIS — I10 Essential (primary) hypertension: Secondary | ICD-10-CM

## 2016-08-10 DIAGNOSIS — E559 Vitamin D deficiency, unspecified: Secondary | ICD-10-CM

## 2016-08-10 DIAGNOSIS — E291 Testicular hypofunction: Secondary | ICD-10-CM

## 2016-08-10 DIAGNOSIS — Z130 Encounter for screening for diseases of the blood and blood-forming organs and certain disorders involving the immune mechanism: Secondary | ICD-10-CM

## 2016-08-10 DIAGNOSIS — Z79899 Other long term (current) drug therapy: Secondary | ICD-10-CM | POA: Diagnosis not present

## 2016-08-10 DIAGNOSIS — Z0000 Encounter for general adult medical examination without abnormal findings: Secondary | ICD-10-CM

## 2016-08-10 DIAGNOSIS — G4733 Obstructive sleep apnea (adult) (pediatric): Secondary | ICD-10-CM

## 2016-08-10 DIAGNOSIS — E782 Mixed hyperlipidemia: Secondary | ICD-10-CM

## 2016-08-10 DIAGNOSIS — Z125 Encounter for screening for malignant neoplasm of prostate: Secondary | ICD-10-CM

## 2016-08-10 DIAGNOSIS — N529 Male erectile dysfunction, unspecified: Secondary | ICD-10-CM

## 2016-08-10 DIAGNOSIS — R7303 Prediabetes: Secondary | ICD-10-CM

## 2016-08-10 DIAGNOSIS — Z136 Encounter for screening for cardiovascular disorders: Secondary | ICD-10-CM

## 2016-08-10 LAB — CBC WITH DIFFERENTIAL/PLATELET
Basophils Absolute: 0 cells/uL (ref 0–200)
Basophils Relative: 0 %
EOS PCT: 1 %
Eosinophils Absolute: 89 cells/uL (ref 15–500)
HCT: 44.6 % (ref 38.5–50.0)
Hemoglobin: 15.1 g/dL (ref 13.2–17.1)
LYMPHS ABS: 2492 {cells}/uL (ref 850–3900)
LYMPHS PCT: 28 %
MCH: 31.1 pg (ref 27.0–33.0)
MCHC: 33.9 g/dL (ref 32.0–36.0)
MCV: 91.8 fL (ref 80.0–100.0)
MPV: 9.9 fL (ref 7.5–12.5)
Monocytes Absolute: 623 cells/uL (ref 200–950)
Monocytes Relative: 7 %
NEUTROS PCT: 64 %
Neutro Abs: 5696 cells/uL (ref 1500–7800)
Platelets: 281 10*3/uL (ref 140–400)
RBC: 4.86 MIL/uL (ref 4.20–5.80)
RDW: 13.4 % (ref 11.0–15.0)
WBC: 8.9 10*3/uL (ref 3.8–10.8)

## 2016-08-10 LAB — HEMOGLOBIN A1C
Hgb A1c MFr Bld: 5.2 % (ref <5.7)
Mean Plasma Glucose: 103 mg/dL

## 2016-08-10 LAB — TSH: TSH: 0.96 mIU/L (ref 0.40–4.50)

## 2016-08-10 MED ORDER — PHENTERMINE HCL 37.5 MG PO TABS: 37.5000 mg | ORAL_TABLET | Freq: Every day | ORAL | 2 refills | Status: DC

## 2016-08-10 MED ORDER — SILDENAFIL CITRATE 20 MG PO TABS: ORAL_TABLET | ORAL | 0 refills | Status: DC

## 2016-08-10 NOTE — Progress Notes (Signed)
Complete Physical  Assessment and Plan:   1. Essential hypertension  - Urinalysis, Routine w reflex microscopic - Microalbumin / creatinine urine ratio - EKG 12-Lead - TSH  2. OSA (obstructive sleep apnea) -wants to try weight loss for treatment -phentermine to assist with weight loss  3. Hypogonadism male -on testosterone supplementation -testosterone level  4. Mixed hyperlipidemia -will likely need continued pravastatin - Lipid panel  5. Erectile dysfunction, unspecified erectile dysfunction type -sildenafil 20 mg  6. Pre-diabetes -cont diet and exercise - Hemoglobin A1c - Insulin, random  7. Vitamin D deficiency disease -cont Vit D - VITAMIN D 25 Hydroxy (Vit-D Deficiency, Fractures)  8. Medication management  - CBC with Differential/Platelet - BASIC METABOLIC PANEL WITH GFR - Hepatic function panel - Magnesium  9. Screening for deficiency anemia  - Iron and TIBC - Vitamin B12  10. Screening for prostate cancer  - PSA  11. Testosterone deficiency - Testosterone   Discussed med's effects and SE's. Screening labs and tests as requested with regular follow-up as recommended.  HPI Patient presents for a complete physical.   His blood pressure has been controlled at home, today their BP is BP: 138/82 He does not workout. He denies chest pain, shortness of breath, dizziness.   He is not on cholesterol medication and denies myalgias. His cholesterol is at goal. The cholesterol last visit was:   Lab Results  Component Value Date   CHOL 187 02/16/2015   HDL 59 02/16/2015   LDLCALC 92 02/16/2015   TRIG 178 (H) 02/16/2015   CHOLHDL 3.2 02/16/2015  He did run out of the pravastatin and has been out of it for about a month.  He is hoping it still looks good.    He does have acid reflux if he does not take the protonix.  He reports that he can eat what he wants when he take them.    He has been working on diet and exercise for prediabetes, he is on  bASA, he is not on ACE/ARB and denies foot ulcerations, hyperglycemia, hypoglycemia , increased appetite, nausea, paresthesia of the feet, polydipsia, polyuria, visual disturbances, vomiting and weight loss. Last A1C in the office was:  Lab Results  Component Value Date   HGBA1C 5.5 02/16/2015    Patient is on Vitamin D supplement.   Lab Results  Component Value Date   VD25OH 29 (L) 02/16/2015      Last PSA was: Lab Results  Component Value Date   PSA 0.41 02/16/2015  .  Denies BPH symptoms daytime frequency, double voiding, dysuria, hematuria, hesitancy, incontinence, intermittency, nocturia, sensation of incomplete bladder emptying, suprapubic pain, urgency or weak urinary stream.  He reports that he has been sleeping well. He reports that he is trying to lose weight instead of using the CPAP machine.      Current Medications:  Current Outpatient Prescriptions on File Prior to Visit  Medication Sig Dispense Refill  . B-D 3CC LUER-LOK SYR 22GX1" 22G X 1" 3 ML MISC USE AS DIRECTED 100 each 3  . losartan (COZAAR) 50 MG tablet TAKE 1 TABLET (50 MG TOTAL) BY MOUTH DAILY. 90 tablet 1  . testosterone cypionate (DEPOTESTOSTERONE CYPIONATE) 200 MG/ML injection INJECT IM EVERY 2 WEEKS 10 mL 5   No current facility-administered medications on file prior to visit.     Health Maintenance:  Immunization History  Administered Date(s) Administered  . Tdap 01/23/2014    Tetanus: 2015 Colonoscopy:2017 Eye Exam:  Due for eye check Dentist:  due for visit.    Patient Care Team: Lucky CowboyWilliam McKeown, MD as PCP - General (Internal Medicine) Charna ElizabethJyothi Mann, MD as Consulting Physician (Gastroenterology)  Allergies: No Known Allergies  Medical History:  Past Medical History:  Diagnosis Date  . Hyperlipidemia   . Hypertension   . Hypogonadism male   . OSA (obstructive sleep apnea)   . Other testicular hypofunction   . Pre-diabetes   . Vitamin D deficiency disease     Surgical History:   Past Surgical History:  Procedure Laterality Date  . RIGID ESOPHAGOSCOPY N/A 04/10/2016   Procedure: RIGID ESOPHAGOSCOPY;  Surgeon: Christia Readingwight Bates, MD;  [facility];  Service: ENT;  Laterality: N/A;    Family History:  Family History  Problem Relation Age of Onset  . Diabetes Neg Hx   . Heart disease Neg Hx   . Hyperlipidemia Neg Hx   . Hypertension Neg Hx   . Stroke Neg Hx   . Cancer Neg Hx     Social History:   Social History  Substance Use Topics  . Smoking status: Former Smoker    Packs/day: 1.00    Quit date: 11/25/2010  . Smokeless tobacco: Never Used  . Alcohol use 0.0 oz/week    Review of Systems:  Review of Systems  Constitutional: Negative for chills, fever and malaise/fatigue.  HENT: Negative for congestion, ear pain and sore throat.   Eyes: Negative.   Respiratory: Negative for cough, shortness of breath and wheezing.   Cardiovascular: Negative for chest pain, palpitations and leg swelling.  Gastrointestinal: Negative for abdominal pain, blood in stool, constipation, diarrhea, heartburn and melena.  Genitourinary: Negative.   Skin: Negative.   Neurological: Negative for dizziness, sensory change, loss of consciousness and headaches.  Psychiatric/Behavioral: Negative for depression. The patient is not nervous/anxious and does not have insomnia.     Physical Exam: Estimated body mass index is 36.16 kg/m as calculated from the following:   Height as of this encounter: 5\' 10"  (1.778 m).   Weight as of this encounter: 252 lb (114.3 kg). BP 138/82   Pulse 74   Temp 98 F (36.7 C) (Temporal)   Resp 18   Ht 5\' 10"  (1.778 m)   Wt 252 lb (114.3 kg)   BMI 36.16 kg/m   General Appearance: Well nourished, in no apparent distress.  Eyes: PERRLA, EOMs, conjunctiva no swelling or erythema ENT/Mouth: Ear canals clear bilaterally with no erythema, swelling, discharge.  TMs normal bilaterally with no erythema, bulging, or retractions.  Oropharynx clear and moist  with no exudate, swelling, or erythema.  Dentition normal.   Neck: Supple, thyroid normal. No bruits, JVD, cervical adenopathy Respiratory: Respiratory effort normal, BS equal bilaterally without rales, rhonchi, wheezing or stridor.  Cardio: RRR without murmurs, rubs or gallops. Brisk peripheral pulses without edema.  Chest: symmetric, with normal excursions Abdomen: Soft, nontender, no guarding, rebound, hernias, masses, or organomegaly. Musculoskeletal: Full ROM all peripheral extremities,5/5 strength, and normal gait.  Skin: Warm, dry without rashes, lesions, ecchymosis. Neuro: A&Ox3, Cranial nerves intact, reflexes equal bilaterally. Normal muscle tone, no cerebellar symptoms. Sensation intact.  Psych: Normal affect, Insight and Judgment appropriate.   EKG: WNL no changes.  Difficulty with getting pads to stick, V1 V2 are slightly different due to this but no other changes noted.    Over 40 minutes of exam, counseling, chart review and critical decision making was performed  [name] 2:29 PM [facility]

## 2016-08-11 ENCOUNTER — Other Ambulatory Visit: Payer: Self-pay | Admitting: Internal Medicine

## 2016-08-11 LAB — URINALYSIS, ROUTINE W REFLEX MICROSCOPIC
Bilirubin Urine: NEGATIVE
Glucose, UA: NEGATIVE
HGB URINE DIPSTICK: NEGATIVE
Ketones, ur: NEGATIVE
LEUKOCYTES UA: NEGATIVE
NITRITE: NEGATIVE
PROTEIN: NEGATIVE
Specific Gravity, Urine: 1.016 (ref 1.001–1.035)
pH: 7 (ref 5.0–8.0)

## 2016-08-11 LAB — INSULIN, RANDOM: INSULIN: 10 u[IU]/mL (ref 2.0–19.6)

## 2016-08-11 LAB — IRON AND TIBC
%SAT: 25 % (ref 15–60)
Iron: 84 ug/dL (ref 50–180)
TIBC: 331 ug/dL (ref 250–425)
UIBC: 247 ug/dL (ref 125–400)

## 2016-08-11 LAB — HEPATIC FUNCTION PANEL
ALT: 12 U/L (ref 9–46)
AST: 15 U/L (ref 10–35)
Albumin: 4.3 g/dL (ref 3.6–5.1)
Alkaline Phosphatase: 78 U/L (ref 40–115)
BILIRUBIN INDIRECT: 0.4 mg/dL (ref 0.2–1.2)
BILIRUBIN TOTAL: 0.5 mg/dL (ref 0.2–1.2)
Bilirubin, Direct: 0.1 mg/dL (ref <=0.2)
Total Protein: 7 g/dL (ref 6.1–8.1)

## 2016-08-11 LAB — TESTOSTERONE: TESTOSTERONE: 296 ng/dL (ref 250–827)

## 2016-08-11 LAB — LIPID PANEL
CHOL/HDL RATIO: 4.8 ratio (ref <5.0)
CHOLESTEROL: 242 mg/dL — AB (ref <200)
HDL: 50 mg/dL (ref >40)
TRIGLYCERIDES: 448 mg/dL — AB (ref <150)

## 2016-08-11 LAB — BASIC METABOLIC PANEL WITH GFR
BUN: 14 mg/dL (ref 7–25)
CALCIUM: 9.7 mg/dL (ref 8.6–10.3)
CO2: 28 mmol/L (ref 20–31)
Chloride: 103 mmol/L (ref 98–110)
Creat: 1.1 mg/dL (ref 0.70–1.33)
GFR, EST AFRICAN AMERICAN: 86 mL/min (ref >=60)
GFR, EST NON AFRICAN AMERICAN: 75 mL/min (ref >=60)
Glucose, Bld: 71 mg/dL (ref 65–99)
Potassium: 4.3 mmol/L (ref 3.5–5.3)
SODIUM: 141 mmol/L (ref 135–146)

## 2016-08-11 LAB — MICROALBUMIN / CREATININE URINE RATIO
CREATININE, URINE: 126 mg/dL (ref 20–370)
MICROALB UR: 0.2 mg/dL
MICROALB/CREAT RATIO: 2 ug/mg{creat} (ref <30)

## 2016-08-11 LAB — MAGNESIUM: Magnesium: 1.8 mg/dL (ref 1.5–2.5)

## 2016-08-11 LAB — VITAMIN B12: VITAMIN B 12: 442 pg/mL (ref 200–1100)

## 2016-08-11 LAB — VITAMIN D 25 HYDROXY (VIT D DEFICIENCY, FRACTURES): VIT D 25 HYDROXY: 22 ng/mL — AB (ref 30–100)

## 2016-08-11 LAB — PSA: PSA: 0.4 ng/mL (ref <=4.0)

## 2016-08-11 MED ORDER — PRAVASTATIN SODIUM 40 MG PO TABS: 40.0000 mg | ORAL_TABLET | Freq: Every day | ORAL | 1 refills | Status: DC

## 2016-08-16 ENCOUNTER — Encounter: Payer: Self-pay | Admitting: *Deleted

## 2016-08-16 ENCOUNTER — Other Ambulatory Visit: Payer: Self-pay | Admitting: Physician Assistant

## 2016-08-18 ENCOUNTER — Other Ambulatory Visit: Payer: Self-pay | Admitting: *Deleted

## 2016-08-18 ENCOUNTER — Other Ambulatory Visit: Payer: Self-pay | Admitting: Internal Medicine

## 2016-09-09 ENCOUNTER — Other Ambulatory Visit: Payer: Self-pay | Admitting: Internal Medicine

## 2016-09-09 MED ORDER — LOSARTAN POTASSIUM 50 MG PO TABS: 50.0000 mg | ORAL_TABLET | Freq: Every day | ORAL | 1 refills | Status: DC

## 2016-09-09 MED ORDER — PREDNISONE 20 MG PO TABS: ORAL_TABLET | ORAL | 0 refills | Status: DC

## 2016-09-14 LAB — COMPREHENSIVE METABOLIC PANEL
ALT: 48 U/L
AST: 31 U/L
Albumin: 4.7
Alkaline Phosphatase: 78 U/L
BUN: 14 mg/dL
CO2: 26 mmol/L
Calcium: 9.8 mg/dL
Chloride: 103 mmol/L
Creatinine: 0.9
Glucose: 101 mg/dL
Potassium: 4.4 mmol/L
Sodium: 140 mmol/L
Total Bilirubin: 0.6 mg/dL (ref 0.1–1.4)
Total Protein: 7.2

## 2016-09-14 LAB — LIPID PANEL
Cholesterol, Total: 196 mg/dL
HDL: 63 mg/dL (ref 35–70)
LDL Calculated: 99 mg/dL (ref 0–160)
Triglycerides: 168 mg/dL
VLDL: 34 mg/dL

## 2016-09-14 LAB — CBC
Hematocrit: 45.9 % (ref 41–53)
Hemoglobin: 15.4 g/dL (ref 13.5–17.5)
Platelets: 241 K/??L
RBC: 5 10^6/??L
WBC: 5.7 10^3/mL

## 2016-09-25 ENCOUNTER — Other Ambulatory Visit: Payer: Self-pay | Admitting: Internal Medicine

## 2016-10-05 ENCOUNTER — Encounter: Attending: Cardiovascular Disease | Primary: Family Medicine

## 2016-10-12 ENCOUNTER — Ambulatory Visit
Admit: 2016-10-12 | Discharge: 2016-10-12 | Payer: PRIVATE HEALTH INSURANCE | Attending: Cardiovascular Disease | Primary: Family Medicine

## 2016-10-12 DIAGNOSIS — R002 Palpitations: Secondary | ICD-10-CM

## 2016-10-12 NOTE — Progress Notes (Signed)
CARDIOLOGY NOTE      10/12/2016    RE: Carl Castro  (1961/05/28)                               TO:  Dr.  Nathen May, MD      Thank you for involving me in taking care of your  patient Carl Castro, who is a  56 y.o. year old      male with past medical  history of  Htn, arrythmia  is  seen today Patient  during this  visit has no cardiac complains.      Vitals:    10/12/16 0947   BP: 116/82   Pulse: 68       Current Outpatient Prescriptions   Medication Sig Dispense Refill   ??? aspirin 81 MG tablet Take 81 mg by mouth daily.     ??? rosuvastatin (CRESTOR) 5 MG tablet Take 5 mg by mouth daily.       No current facility-administered medications for this visit.      Allergies: Patient has no known allergies.  Past Medical History:   Diagnosis Date   ??? Atypical chest pain    ??? Family history of early CAD    ??? Frequent PVCs    ??? H/O 24 hour EKG monitoring 11/03/11    48 hour- the predominant rhythm is sinus rhythm with one 10-beat run of nonsustained ventricular tachycardia, the patient is already being referred for EP study to Dr. Dianah Field in [place]   ??? H/O cardiovascular stress test 09/15/2011    09/15/2011-Normal study. No ischemia. LVSF normal. EF 63%.    ??? H/O echocardiogram 09/15/2011    09/15/2011- LVSF normal. EF 55%.Impaired LV relaxation. Mild TR.-report in epic   ??? History of complete ECG 09/08/2011   ??? Hyperlipidemia    ??? Palpitations    ??? S/P cardiac cath 11/11/2011    normal coronaries. reffer for EP study   ??? S/P radiofrequency ablation operation for arrhythmia    ??? Tachycardia      No past surgical history on file.  Family History   Problem Relation Age of Onset   ??? Heart Disease Father      CMP A-fib     Social History   Substance Use Topics   ??? Smoking status: Former Smoker   ??? Smokeless tobacco: Former Neurosurgeon   ??? Alcohol use 2.4 oz/week     4 Cans of beer per week          Review of systems:  HEENT: Neg  Card:neg   GI;Neg  GU: Neg  Neuro: Neg  Psych: Neg  Derm: Neg  MS; Neg  All:  Documented  Constitutional: Neg    Objective:      Physical Exam:  BP 116/82    Pulse 68    Ht 6\' 3"  (1.905 m)    Wt 247 lb 11.2 oz (112.4 kg)    BMI 30.96 kg/m??   Wt Readings from Last 3 Encounters:   10/12/16 247 lb 11.2 oz (112.4 kg)   10/05/15 248 lb (112.5 kg)   09/29/14 255 lb (115.7 kg)     Body mass index is 30.96 kg/m??.  GENERAL - Alert, oriented, pleasant, in no apparent distress.    Head unremarkable  Eyes  Not injected conjunctiva  ENT ??? normal mucosa  Neck - Supple.  No jugular venous distention noted. No carotid  bruits.   Cardiovascular ??? Normal S1 and S2 without obvious murmur or gallop.    Extremities - No cyanosis, clubbing, or significant edema.    Pulmonary ??? No respiratory distress.  No wheezes or rales.    Pulses: Bilateral radial and pedal pulses normal  Abdomen ??? no tenderness  Musculoskeletal ??? normal strength  Neurologic ???   There are  no gross focal neurologic abnormalities.  Skin-  No rash  Affect; normal mood    DATA:  No results found for: CKTOTAL, CKMB, CKMBINDEX, TROPONINI  BNP:  No results found for: BNP  PT/INR:  No results found for: PTINR  No results found for: LABA1C  Lab Results   Component Value Date    CHOL 198 05/09/2015    TRIG 197 05/09/2015    HDL 62 05/09/2015    LDLCALC 98 05/09/2015     No results found for: ALT, AST  TSH:  No results found for: TSH          Assessment/ Plan:     Patient seen , interviewed and examined     PLAN:        -  Hypertension: Patients blood pressure is normal. Patient is advised about low sodium diet. Present medical regimen will not be changed.       - Arrythmia  Stable    -  LIPID MANAGEMENT:  Available lipid  lab data reviewed  and patient was given dietary advice. NCEP- ATP III guidelines reviewed with patient.    -   Changes  in medicines made: No

## 2016-10-12 NOTE — Patient Instructions (Signed)
Please remember to bring all medication bottles or a medication list with you to your appointment.   If you have any questions, please call our office at [phone].

## 2016-10-23 ENCOUNTER — Encounter (HOSPITAL_COMMUNITY): Payer: Self-pay | Admitting: Emergency Medicine

## 2016-10-23 ENCOUNTER — Emergency Department (HOSPITAL_COMMUNITY): Payer: BLUE CROSS/BLUE SHIELD

## 2016-10-23 ENCOUNTER — Emergency Department (HOSPITAL_COMMUNITY)
Admission: EM | Admit: 2016-10-23 | Discharge: 2016-10-23 | Disposition: A | Discharge status: Home / Self Care | Payer: BLUE CROSS/BLUE SHIELD | Attending: Emergency Medicine | Admitting: Emergency Medicine

## 2016-10-23 DIAGNOSIS — I10 Essential (primary) hypertension: Secondary | ICD-10-CM | POA: Diagnosis not present

## 2016-10-23 DIAGNOSIS — K5720 Diverticulitis of large intestine with perforation and abscess without bleeding: Secondary | ICD-10-CM | POA: Insufficient documentation

## 2016-10-23 DIAGNOSIS — Z87891 Personal history of nicotine dependence: Secondary | ICD-10-CM | POA: Diagnosis not present

## 2016-10-23 DIAGNOSIS — K5730 Diverticulosis of large intestine without perforation or abscess without bleeding: Secondary | ICD-10-CM | POA: Diagnosis not present

## 2016-10-23 DIAGNOSIS — R1030 Lower abdominal pain, unspecified: Secondary | ICD-10-CM | POA: Diagnosis present

## 2016-10-23 LAB — URINALYSIS, ROUTINE W REFLEX MICROSCOPIC
Bilirubin Urine: NEGATIVE
Glucose, UA: NEGATIVE mg/dL
Hgb urine dipstick: NEGATIVE
Ketones, ur: NEGATIVE mg/dL
LEUKOCYTES UA: NEGATIVE
Nitrite: NEGATIVE
PROTEIN: NEGATIVE mg/dL
SPECIFIC GRAVITY, URINE: 1.024 (ref 1.005–1.030)
pH: 5 (ref 5.0–8.0)

## 2016-10-23 LAB — COMPREHENSIVE METABOLIC PANEL
ALT: 15 U/L — AB (ref 17–63)
ANION GAP: 10 (ref 5–15)
AST: 19 U/L (ref 15–41)
Albumin: 3.9 g/dL (ref 3.5–5.0)
Alkaline Phosphatase: 92 U/L (ref 38–126)
BUN: 14 mg/dL (ref 6–20)
CHLORIDE: 102 mmol/L (ref 101–111)
CO2: 23 mmol/L (ref 22–32)
CREATININE: 1.08 mg/dL (ref 0.61–1.24)
Calcium: 9.3 mg/dL (ref 8.9–10.3)
GFR calc Af Amer: >60 mL/min (ref >60)
Glucose, Bld: 98 mg/dL (ref 65–99)
Potassium: 4 mmol/L (ref 3.5–5.1)
Sodium: 135 mmol/L (ref 135–145)
Total Bilirubin: 0.6 mg/dL (ref 0.3–1.2)
Total Protein: 7.4 g/dL (ref 6.5–8.1)

## 2016-10-23 LAB — CBC
HCT: 44.5 % (ref 39.0–52.0)
HEMOGLOBIN: 14.6 g/dL (ref 13.0–17.0)
MCH: 30.7 pg (ref 26.0–34.0)
MCHC: 32.8 g/dL (ref 30.0–36.0)
MCV: 93.7 fL (ref 78.0–100.0)
PLATELETS: 265 10*3/uL (ref 150–400)
RBC: 4.75 MIL/uL (ref 4.22–5.81)
RDW: 13.3 % (ref 11.5–15.5)
WBC: 11.6 10*3/uL — AB (ref 4.0–10.5)

## 2016-10-23 LAB — LIPASE, BLOOD: Lipase: 18 U/L (ref 11–51)

## 2016-10-23 MED ORDER — CIPROFLOXACIN HCL 500 MG PO TABS
500.0000 mg | ORAL_TABLET | Freq: Once | ORAL | Status: AC
  Administered 2016-10-23: 500 mg via ORAL
  Filled 2016-10-23: qty 1

## 2016-10-23 MED ORDER — IOPAMIDOL (ISOVUE-300) INJECTION 61%
INTRAVENOUS | Status: AC
  Administered 2016-10-23: 100 mL
  Filled 2016-10-23: qty 100

## 2016-10-23 MED ORDER — METRONIDAZOLE 500 MG PO TABS
500.0000 mg | ORAL_TABLET | Freq: Once | ORAL | Status: AC
  Administered 2016-10-23: 500 mg via ORAL
  Filled 2016-10-23: qty 1

## 2016-10-23 MED ORDER — METRONIDAZOLE 500 MG PO TABS: 500.0000 mg | ORAL_TABLET | Freq: Two times a day (BID) | ORAL | 0 refills | Status: DC

## 2016-10-23 MED ORDER — CLARITHROMYCIN 500 MG PO TABS: 500.0000 mg | ORAL_TABLET | Freq: Two times a day (BID) | ORAL | 0 refills | Status: DC

## 2016-10-23 MED ORDER — CIPROFLOXACIN HCL 500 MG PO TABS: 500.0000 mg | ORAL_TABLET | Freq: Two times a day (BID) | ORAL | 0 refills | Status: DC

## 2016-10-23 MED ORDER — ACETAMINOPHEN 325 MG PO TABS
650.0000 mg | ORAL_TABLET | Freq: Once | ORAL | Status: AC
  Administered 2016-10-23: 650 mg via ORAL
  Filled 2016-10-23: qty 2

## 2016-10-23 NOTE — ED Provider Notes (Signed)
[facility]EMER[facility]ENCY DEPT Provider Note   CSN: [id number] Arrival date & time: 10/23/16  0600     History   Chief Complaint Chief Complaint  Patient presents with  . Abdominal Pain    HPI [name] is a 56 y.o. male.  Patient presents for midline lower abdominal pain which she thought was gas, but became concerned when he had a bowel movement without resolution of the discomfort.  Discomfort present for 1 day.  Discomfort is mild.  He denies fever chills nausea vomiting cough weakness or dizziness.  No prior similar problem.  There are no other known modifying factors.  HPI  Past Medical History:  Diagnosis Date  . Hyperlipidemia   . Hypertension   . Hypogonadism male   . OSA (obstructive sleep apnea)   . Other testicular hypofunction   . Pre-diabetes   . Vitamin D deficiency disease     Patient Active Problem List   Diagnosis Date Noted  . Medication management 02/16/2015  . Noncompliance 02/16/2015  . Obesity 08/05/2014  . ED (erectile dysfunction) 08/05/2014  . Gout 08/05/2014  . Pre-diabetes   . Vitamin D deficiency disease   . Hypogonadism male   . OSA (obstructive sleep apnea)   . Essential hypertension 06/17/2013  . Mixed hyperlipidemia 06/17/2013    Past Surgical History:  Procedure Laterality Date  . RIGID ESOPHAGOSCOPY N/A 04/10/2016   Procedure: RIGID ESOPHAGOSCOPY;  Surgeon: Christia Readingwight Bates, MD;  [facility];  Service: ENT;  Laterality: N/A;       Home Medications    Prior to Admission medications   Medication Sig Start Date End Date Taking? Authorizing Provider  B-D 3CC LUER-LOK SYR 22GX1" 22G X 1" 3 ML MISC USE AS DIRECTED 07/15/13  Yes Melissa Smith, PA-C  cholecalciferol (VITAMIN D) 1000 units tablet Take 5,000 Units by mouth daily.   Yes Historical Provider, MD  losartan (COZAAR) 50 MG tablet Take 1 tablet (50 mg total) by mouth daily. 09/09/16  Yes Courtney Forcucci, PA-C  pantoprazole (PROTONIX) 40 MG tablet TAKE 1 TABLET BY MOUTH EVERY  DAY 30 MINUTES BEFORE FOOD 08/18/16  Yes Lucky CowboyWilliam McKeown, MD  pravastatin (PRAVACHOL) 40 MG tablet Take 1 tablet (40 mg total) by mouth daily. 08/11/16  Yes Courtney Forcucci, PA-C  sildenafil (REVATIO) 20 MG tablet Please take 50 mg to 100 mg as needed for ED 08/10/16  Yes Courtney Forcucci, PA-C  testosterone cypionate (DEPOTESTOSTERONE CYPIONATE) 200 MG/ML injection INJECT 2ML IM EVERY 2 WEEKS Patient taking differently: Inject 200 mg into the muscle every 21 ( twenty-one) days.  02/16/15  Yes Quentin MullingAmanda Collier, PA-C    Family History Family History  Problem Relation Age of Onset  . Diabetes Neg Hx   . Heart disease Neg Hx   . Hyperlipidemia Neg Hx   . Hypertension Neg Hx   . Stroke Neg Hx   . Cancer Neg Hx     Social History Social History  Substance Use Topics  . Smoking status: Former Smoker    Packs/day: 1.00    Quit date: 11/25/2010  . Smokeless tobacco: Never Used  . Alcohol use 0.0 oz/week     Allergies   Patient has no known allergies.   Review of Systems Review of Systems  All other systems reviewed and are negative.    Physical Exam Updated Vital Signs BP 124/78 (BP Location: Right Arm)   Pulse 88   Temp 98 F (36.7 C) (Oral)   Resp 18   Ht 5\' 11"  (  1.803 m)   Wt 249 lb (112.9 kg)   SpO2 98%   BMI 34.73 kg/m   Physical Exam  Constitutional: He is oriented to person, place, and time. He appears well-developed and well-nourished.  HENT:  Head: Normocephalic and atraumatic.  Right Ear: External ear normal.  Left Ear: External ear normal.  Eyes: Conjunctivae and EOM are normal. Pupils are equal, round, and reactive to light.  Neck: Normal range of motion and phonation normal. Neck supple.  Cardiovascular: Normal rate, regular rhythm and normal heart sounds.   Pulmonary/Chest: Effort normal and breath sounds normal. He exhibits no bony tenderness.  Abdominal: Soft. There is no tenderness.  Genitourinary:  Genitourinary Comments: Normal anus.  Normal  prostate.  No rectal mass or visible bleeding.  Musculoskeletal: Normal range of motion.  Neurological: He is alert and oriented to person, place, and time. No cranial nerve deficit or sensory deficit. He exhibits normal muscle tone. Coordination normal.  Skin: Skin is warm, dry and intact.  Psychiatric: He has a normal mood and affect. His behavior is normal. Judgment and thought content normal.  Nursing note and vitals reviewed.    ED Treatments / Results  Labs (all labs ordered are listed, but only abnormal results are displayed) Labs Reviewed  COMPREHENSIVE METABOLIC PANEL - Abnormal; Notable for the following:       Result Value   ALT 15 (*)    All other components within normal limits  CBC - Abnormal; Notable for the following:    WBC 11.6 (*)    All other components within normal limits  LIPASE, BLOOD  URINALYSIS, ROUTINE W REFLEX MICROSCOPIC    EKG  EKG Interpretation None       Radiology Ct Abdomen Pelvis W Contrast  Result Date: 10/23/2016 CLINICAL DATA:  Suprapubic pressure since yesterday. EXAM: CT ABDOMEN AND PELVIS WITH CONTRAST TECHNIQUE: Multidetector CT imaging of the abdomen and pelvis was performed using the standard protocol following bolus administration of intravenous contrast. CONTRAST:  ISOVUE-300 IOPAMIDOL (ISOVUE-300) INJECTION 61% COMPARISON:  Abdomen and pelvis radiographs dated 11/26/2015. Abdomen and pelvis CT dated 12/23/2005. FINDINGS: Lower chest: Bilateral retroareolar glandular tissue. Minimal right lower lobe linear atelectasis or scarring. Hepatobiliary: No focal liver abnormality is seen. No gallstones, gallbladder wall thickening, or biliary dilatation. Pancreas: Unremarkable. No pancreatic ductal dilatation or surrounding inflammatory changes. Spleen: Normal in size without focal abnormality. Adrenals/Urinary Tract: Adrenal glands are unremarkable. Kidneys are normal, without renal calculi, focal lesion, or hydronephrosis. Bladder is  unremarkable. Stomach/Bowel: Multiple sigmoid colon diverticula. Concentric wall thickening involving the mid sigmoid colon in an area of diverticula, with adjacent pericolonic soft tissue stranding. These changes are in the mid to inferior pelvis centrally. No fluid collections or free peritoneal air. Normal appearing stomach, small bowel and appendix. Vascular/Lymphatic: Mild atheromatous arterial calcifications, including the abdominal aorta, without aneurysm. No enlarged lymph nodes. Reproductive: Normal sized prostate gland. Other: Tiny umbilical hernia containing fat. Musculoskeletal: Lower thoracic and mild lumbar spine degenerative changes. Mild-to-moderate right hip degenerative changes and mild left hip degenerative changes. IMPRESSION: 1. Sigmoid diverticulosis and diverticulitis without abscess. 2. Mild aortic atherosclerosis. 3. Mild to moderate right and mild left hip degenerative changes. Electronically Signed   By: Beckie Salts M.D.   On: 10/23/2016 12:18    Procedures Procedures (including critical care time)  Medications Ordered in ED Medications  acetaminophen (TYLENOL) tablet 650 mg (650 mg Oral Given 10/23/16 0735)  iopamidol (ISOVUE-300) 61 % injection (100 mLs  Contrast Given 10/23/16 1137)  metroNIDAZOLE (FLAGYL) tablet 500 mg (500 mg Oral Given 10/23/16 1257)  ciprofloxacin (CIPRO) tablet 500 mg (500 mg Oral Given 10/23/16 1257)     Initial Impression / Assessment and Plan / ED Course  I have reviewed the triage vital signs and the nursing notes.  Pertinent labs & imaging results that were available during my care of the patient were reviewed by me and considered in my medical decision making (see chart for details).  Clinical Course as of Oct 24 1334  Sun Oct 23, 2016  1610 Patient complains of persistent pressure-like sensation in his lower abdomen which is worse with sitting up.  No exacerbation of pain, with flexion of the hips bilaterally.  Mild white blood cell count  elevation, with pain, concerning for lower abdominal process such as appendicitis.  CT ordered.  [EW]    Clinical Course User Index [EW] Mancel Bale, MD    Medications  acetaminophen (TYLENOL) tablet 650 mg (650 mg Oral Given 10/23/16 0735)  iopamidol (ISOVUE-300) 61 % injection (100 mLs  Contrast Given 10/23/16 1137)  metroNIDAZOLE (FLAGYL) tablet 500 mg (500 mg Oral Given 10/23/16 1257)  ciprofloxacin (CIPRO) tablet 500 mg (500 mg Oral Given 10/23/16 1257)    Patient Vitals for the past 24 hrs:  BP Temp Temp src Pulse Resp SpO2 Height Weight  10/23/16 1258 124/78 - - 88 18 98 % - -  10/23/16 1005 130/67 - - 62 18 100 % - -  10/23/16 9604 - - - - - - 5\' 11"  (1.803 m) 249 lb (112.9 kg)  10/23/16 0610 137/89 98 F (36.7 C) Oral 78 - 98 % - -    1:36 PM Reevaluation with update and discussion. After initial assessment and treatment, an updated evaluation reveals he remains comfortable and has no further complaints.  Findings discussed with patient and wife all questions answered. Vi Biddinger L    Final Clinical Impressions(s) / ED Diagnoses   Final diagnoses:  Diverticulitis of large intestine with perforation without abscess or bleeding    The evaluation is consistent with uncomplicated diverticulitis, with diverticulosis.  The patient is nontoxic.  Doubt serious bacterial infection or impending vascular collapse.  Nursing Notes Reviewed/ Care Coordinated Applicable Imaging Reviewed Interpretation of Laboratory Data incorporated into ED treatment  The patient appears reasonably screened and/or stabilized for discharge and I doubt any other medical condition or other [facility] requiring further screening, evaluation, or treatment in the ED at this time prior to discharge.  Plan: Home Medications-APAP for pain and fever; Home Treatments-rest, fluids; return here if the recommended treatment, does not improve the symptoms; Recommended follow up-PCP as needed and GI follow-up in 1  week   New Prescriptions New Prescriptions   No medications on file     Mancel Bale, MD 10/23/16 1336

## 2016-10-23 NOTE — Discharge Instructions (Signed)
Plenty of rest and drink a lot of fluids.  Take Tylenol for fever or pain.  Certain antibiotic prescriptions, late this evening.  Return here, if needed, for problems.

## 2016-10-23 NOTE — ED Triage Notes (Signed)
Pt c/o 10/10 lower abd pain, denies fever, no nausea or vomiting. LBM yesterday.

## 2016-10-25 ENCOUNTER — Encounter: Payer: Self-pay | Admitting: Internal Medicine

## 2016-10-25 ENCOUNTER — Ambulatory Visit (INDEPENDENT_AMBULATORY_CARE_PROVIDER_SITE_OTHER): Payer: BLUE CROSS/BLUE SHIELD | Admitting: Internal Medicine

## 2016-10-25 VITALS — BP 128/84 | HR 78 | Temp 98.4°F | Resp 18 | Ht 70.0 in | Wt 242.0 lb

## 2016-10-25 DIAGNOSIS — K5732 Diverticulitis of large intestine without perforation or abscess without bleeding: Secondary | ICD-10-CM | POA: Diagnosis not present

## 2016-10-25 MED ORDER — METRONIDAZOLE 500 MG PO TABS: 500.0000 mg | ORAL_TABLET | Freq: Two times a day (BID) | ORAL | 0 refills | Status: DC

## 2016-10-25 MED ORDER — CIPROFLOXACIN HCL 500 MG PO TABS: 500.0000 mg | ORAL_TABLET | Freq: Two times a day (BID) | ORAL | 0 refills | Status: DC

## 2016-10-25 MED ORDER — TRAMADOL HCL 50 MG PO TABS: 50.0000 mg | ORAL_TABLET | Freq: Four times a day (QID) | ORAL | 0 refills | Status: DC | PRN

## 2016-10-25 NOTE — Patient Instructions (Signed)

## 2016-10-25 NOTE — Progress Notes (Signed)
Assessment and Plan:   1. Diverticulitis of large intestine without perforation or abscess without bleeding -cont cipro and flagyl x 10 days -mild improvement since ER visit -still having pain so will start treating the pain with tramadol prn, if not needed patient recommended to use tylenol or ibuprofen as needed  -miralax daily to have softer stools so no pain with straining -start daily fiber supplement when this resolves -clear broth diet and bland foods until iprovement and then can slowly advance to a normal diet -go to ER if worsening abdominal pain, intractable nausea and vomiting or severe blood in stool -patient aware to not consume alcohol with flagyl for 3 days after finishing course to avoid N/V   HPI 56 y.o.male presents for follow up from the hospital. Admit date to the hospital was 10/23/16, patient was discharged from the hospital on 10/23/16.  Patient was seen for persistent abdominal pain in the left lower quadrant.  He had normal prostate and normal abdominal exam.  He did have mild increase in WBCs and otherwise normal blood work.  CT abdomen and pelvis did show some changes to the sigmoid colon with diverticulitis without abscess. He was discharged home on cipro and flagyl and has completed two days worth of the antibiotic.  He reports that he is still having waxing and waning abodiminal pain.  He reports that he has been having some difficulty with pain.  He finds that he has not had a bowel movement since Saturday.  He reports that he feels mildly backed up.    Images while in the hospital: Ct Abdomen Pelvis W Contrast  Result Date: 10/23/2016 CLINICAL DATA:  Suprapubic pressure since yesterday. EXAM: CT ABDOMEN AND PELVIS WITH CONTRAST TECHNIQUE: Multidetector CT imaging of the abdomen and pelvis was performed using the standard protocol following bolus administration of intravenous contrast. CONTRAST:  100mL ISOVUE-300 IOPAMIDOL (ISOVUE-300) INJECTION 61% COMPARISON:   Abdomen and pelvis radiographs dated 11/26/2015. Abdomen and pelvis CT dated 12/23/2005. FINDINGS: Lower chest: Bilateral retroareolar glandular tissue. Minimal right lower lobe linear atelectasis or scarring. Hepatobiliary: No focal liver abnormality is seen. No gallstones, gallbladder wall thickening, or biliary dilatation. Pancreas: Unremarkable. No pancreatic ductal dilatation or surrounding inflammatory changes. Spleen: Normal in size without focal abnormality. Adrenals/Urinary Tract: Adrenal glands are unremarkable. Kidneys are normal, without renal calculi, focal lesion, or hydronephrosis. Bladder is unremarkable. Stomach/Bowel: Multiple sigmoid colon diverticula. Concentric wall thickening involving the mid sigmoid colon in an area of diverticula, with adjacent pericolonic soft tissue stranding. These changes are in the mid to inferior pelvis centrally. No fluid collections or free peritoneal air. Normal appearing stomach, small bowel and appendix. Vascular/Lymphatic: Mild atheromatous arterial calcifications, including the abdominal aorta, without aneurysm. No enlarged lymph nodes. Reproductive: Normal sized prostate gland. Other: Tiny umbilical hernia containing fat. Musculoskeletal: Lower thoracic and mild lumbar spine degenerative changes. Mild-to-moderate right hip degenerative changes and mild left hip degenerative changes. IMPRESSION: 1. Sigmoid diverticulosis and diverticulitis without abscess. 2. Mild aortic atherosclerosis. 3. Mild to moderate right and mild left hip degenerative changes. Electronically Signed   By: Beckie SaltsSteven  Reid M.D.   On: 10/23/2016 12:18    Past Medical History:  Diagnosis Date  . Hyperlipidemia   . Hypertension   . Hypogonadism male   . OSA (obstructive sleep apnea)   . Other testicular hypofunction   . Pre-diabetes   . Vitamin D deficiency disease      No Known Allergies    Current Outpatient Prescriptions on File Prior to Visit  Medication Sig Dispense Refill   . B-D 3CC LUER-LOK SYR 22GX1" 22G X 1" 3 ML MISC USE AS DIRECTED 100 each 3  . cholecalciferol (VITAMIN D) 1000 units tablet Take 5,000 Units by mouth daily.    . ciprofloxacin (CIPRO) 500 MG tablet Take 1 tablet (500 mg total) by mouth 2 (two) times daily. One po bid x 7 days 14 tablet 0  . losartan (COZAAR) 50 MG tablet Take 1 tablet (50 mg total) by mouth daily. 90 tablet 1  . metroNIDAZOLE (FLAGYL) 500 MG tablet Take 1 tablet (500 mg total) by mouth 2 (two) times daily. One po bid x 7 days 14 tablet 0  . pantoprazole (PROTONIX) 40 MG tablet TAKE 1 TABLET BY MOUTH EVERY DAY 30 MINUTES BEFORE FOOD 90 tablet 1  . pravastatin (PRAVACHOL) 40 MG tablet Take 1 tablet (40 mg total) by mouth daily. 90 tablet 1  . sildenafil (REVATIO) 20 MG tablet Please take 50 mg to 100 mg as needed for ED 90 tablet 0  . testosterone cypionate (DEPOTESTOSTERONE CYPIONATE) 200 MG/ML injection INJECT IM EVERY 2 WEEKS (Patient taking differently: Inject 200 mg into the muscle every 21 ( twenty-one) days. ) 10 mL 5   No current facility-administered medications on file prior to visit.     Review of Systems  Constitutional: Negative for chills, fever and malaise/fatigue.  Respiratory: Negative for cough, shortness of breath and wheezing.   Cardiovascular: Negative for chest pain, palpitations and leg swelling.  Gastrointestinal: Positive for abdominal pain and constipation. Negative for blood in stool, diarrhea, heartburn, melena, nausea and vomiting.  Genitourinary: Negative.   Neurological: Negative.   Psychiatric/Behavioral: Negative.      Physical Exam: Filed Weights   10/25/16 1143  Weight: 242 lb (109.8 kg)   Resp 18   Ht 5\' 10"  (1.778 m)   Wt 242 lb (109.8 kg)   BMI 34.72 kg/m  General Appearance: Well nourished, in no apparent distress. Eyes: PERRLA, EOMs, conjunctiva no swelling or erythema Sinuses: No Frontal/maxillary tenderness ENT/Mouth: Ext aud canals clear, TMs without erythema,  bulging. No erythema, swelling, or exudate on post pharynx.  Tonsils not swollen or erythematous. Hearing normal.  Neck: Supple, thyroid normal.  Respiratory: Respiratory effort normal, BS equal bilaterally without rales, rhonchi, wheezing or stridor.  Cardio: RRR with no MRGs. Brisk peripheral pulses without edema.  Abdomen: Soft, + BS.  LLQ pain on palpation, no guarding, rebound, hernias, masses. Lymphatics: Non tender without lymphadenopathy.  Musculoskeletal: Full ROM, 5/5 strength, normal gait.  Skin: Warm, dry without rashes, lesions, ecchymosis.  Neuro: Cranial nerves intact. Normal muscle tone, no cerebellar symptoms. Sensation intact.  Psych: Awake and oriented X 3, normal affect, Insight and Judgment appropriate.     Terri Piedra, PA-C 11:55 AM [facility]

## 2016-11-28 NOTE — Progress Notes (Deleted)
Assessment and Plan:  Hypertension:  -Continue medication,  -monitor blood pressure at home.  -Continue DASH diet.   -Reminder to go to the ER if any CP, SOB, nausea, dizziness, severe HA, changes vision/speech, left arm numbness and tingling, and jaw pain.  Cholesterol: -Continue diet and exercise.   Pre-diabetes: -Continue diet and exercise.   Vitamin D Def: -continue medications.   Chronic constipation -amitiza prescription sent in  Morbid Obesity with co morbidities - long discussion about weight loss, diet, and exercise  Continue diet and meds as discussed. Further disposition pending results of labs. Future Appointments Date Time Provider Department Center  11/29/2016 3:45 PM Quentin Mulling, PA-C [facility] None  08/10/2017 2:00 PM Courtney Forcucci, PA-C [facility] None     HPI 56 y.o. AA male  presents for 3 month follow up with hypertension, hyperlipidemia, prediabetes and vitamin D.   His blood pressure has been controlled at home, today their BP is  .   He does not workout. He denies chest pain, shortness of breath, dizziness.  He reports that he has been doing some more exercise.     He is on cholesterol medication and denies myalgias. His cholesterol is at goal. The cholesterol last visit was:   Lab Results  Component Value Date   CHOL 242 (H) 08/10/2016   HDL 50 08/10/2016   LDLCALC NOT CALC 08/10/2016   TRIG 448 (H) 08/10/2016   CHOLHDL 4.8 08/10/2016    He has been working on diet and exercise for prediabetes, and denies foot ulcerations, hyperglycemia, hypoglycemia , increased appetite, nausea, paresthesia of the feet, polydipsia, polyuria, visual disturbances, vomiting and weight loss. Last A1C in the office was:  Lab Results  Component Value Date   HGBA1C 5.2 08/10/2016   Patient is on Vitamin D supplement.  Lab Results  Component Value Date   VD25OH 22 (L) 08/10/2016     He reports that his constipation was a lot better when he was taking the  amitiza.  He reports that he has been increasing his water and also increasing his fiber intake.   BMI is There is no height or weight on file to calculate BMI., he is working on diet and exercise. Wt Readings from Last 3 Encounters:  10/25/16 242 lb (109.8 kg)  10/23/16 249 lb (112.9 kg)  08/10/16 252 lb (114.3 kg)     Current Medications:  Current Outpatient Prescriptions on File Prior to Visit  Medication Sig Dispense Refill  . B-D 3CC LUER-LOK SYR 22GX1" 22G X 1" 3 ML MISC USE AS DIRECTED 100 each 3  . cholecalciferol (VITAMIN D) 1000 units tablet Take 5,000 Units by mouth daily.    . ciprofloxacin (CIPRO) 500 MG tablet Take 1 tablet (500 mg total) by mouth 2 (two) times daily. One po bid x 7 days 6 tablet 0  . losartan (COZAAR) 50 MG tablet Take 1 tablet (50 mg total) by mouth daily. 90 tablet 1  . metroNIDAZOLE (FLAGYL) 500 MG tablet Take 1 tablet (500 mg total) by mouth 2 (two) times daily. One po bid x 7 days 6 tablet 0  . pantoprazole (PROTONIX) 40 MG tablet TAKE 1 TABLET BY MOUTH EVERY DAY 30 MINUTES BEFORE FOOD 90 tablet 1  . pravastatin (PRAVACHOL) 40 MG tablet Take 1 tablet (40 mg total) by mouth daily. 90 tablet 1  . sildenafil (REVATIO) 20 MG tablet Please take 50 mg to 100 mg as needed for ED 90 tablet 0  . testosterone cypionate (DEPOTESTOSTERONE CYPIONATE)  200 MG/ML injection INJECT IM EVERY 2 WEEKS (Patient taking differently: Inject 200 mg into the muscle every 21 ( twenty-one) days. ) 10 mL 5  . traMADol (ULTRAM) 50 MG tablet Take 1 tablet (50 mg total) by mouth every 6 (six) hours as needed. 60 tablet 0   No current facility-administered medications on file prior to visit.     Medical History:  Past Medical History:  Diagnosis Date  . Hyperlipidemia   . Hypertension   . Hypogonadism male   . OSA (obstructive sleep apnea)   . Other testicular hypofunction   . Pre-diabetes   . Vitamin D deficiency disease     Allergies: No Known Allergies   Review of  Systems:  Review of Systems  Constitutional: Negative for chills, fever and malaise/fatigue.  HENT: Negative for congestion, ear pain and sore throat.   Respiratory: Negative for cough, shortness of breath and wheezing.   Cardiovascular: Negative for chest pain, palpitations and leg swelling.  Gastrointestinal: Positive for constipation. Negative for abdominal pain, blood in stool, diarrhea, heartburn and melena.  Genitourinary: Negative.   Skin: Negative.   Neurological: Negative for dizziness, loss of consciousness and headaches.  Psychiatric/Behavioral: Negative for depression. The patient is not nervous/anxious and does not have insomnia.     Family history- Review and unchanged  Social history- Review and unchanged  Physical Exam: There were no vitals taken for this visit. Wt Readings from Last 3 Encounters:  10/25/16 242 lb (109.8 kg)  10/23/16 249 lb (112.9 kg)  08/10/16 252 lb (114.3 kg)    General Appearance: Well nourished well developed, in no apparent distress. Eyes: PERRLA, EOMs, conjunctiva no swelling or erythema ENT/Mouth: Ear canals normal without obstruction, swelling, erythma, discharge.  TMs normal bilaterally.  Oropharynx moist, clear, without exudate, or postoropharyngeal swelling. Neck: Supple, thyroid normal,no cervical adenopathy  Respiratory: Respiratory effort normal, Breath sounds clear A&P without rhonchi, wheeze, or rale.  No retractions, no accessory usage. Cardio: RRR with no MRGs. Brisk peripheral pulses without edema.  Abdomen: Soft, + BS,  Non tender, no guarding, rebound, hernias, masses. Musculoskeletal: Full ROM, 5/5 strength, Normal gait Skin: Warm, dry without rashes, lesions, ecchymosis.  Neuro: Awake and oriented X 3, Cranial nerves intact. Normal muscle tone, no cerebellar symptoms. Psych: Normal affect, Insight and Judgment appropriate.    Quentin Mulling, PA-C 7:38 AM [facility]

## 2016-11-29 ENCOUNTER — Ambulatory Visit: Payer: Self-pay | Admitting: Physician Assistant

## 2016-11-29 ENCOUNTER — Ambulatory Visit: Payer: Self-pay | Admitting: Internal Medicine

## 2017-01-21 ENCOUNTER — Other Ambulatory Visit: Payer: Self-pay | Admitting: Internal Medicine

## 2017-02-20 ENCOUNTER — Other Ambulatory Visit: Payer: Self-pay

## 2017-02-20 MED ORDER — PRAVASTATIN SODIUM 40 MG PO TABS: 40.0000 mg | ORAL_TABLET | Freq: Every day | ORAL | 0 refills | Status: DC

## 2017-03-16 ENCOUNTER — Other Ambulatory Visit: Payer: Self-pay

## 2017-03-16 MED ORDER — LOSARTAN POTASSIUM 50 MG PO TABS: 50.0000 mg | ORAL_TABLET | Freq: Every day | ORAL | 1 refills | Status: DC

## 2017-04-03 ENCOUNTER — Encounter: Payer: Self-pay | Admitting: Physician Assistant

## 2017-04-21 NOTE — Progress Notes (Deleted)
Assessment and Plan:  Hypertension:  -Continue medication,  -monitor blood pressure at home.  -Continue DASH diet.   -Reminder to go to the ER if any CP, SOB, nausea, dizziness, severe HA, changes vision/speech, left arm numbness and tingling, and jaw pain.  Cholesterol: -Continue diet and exercise.   Pre-diabetes: -Continue diet and exercise.   Vitamin D Def: -continue medications.   Continue diet and meds as discussed. Further disposition pending results of labs.  HPI 56 y.o. male  presents for 3 month follow up with hypertension, hyperlipidemia, prediabetes and vitamin D.   His blood pressure has been controlled at home, today their BP is  .   He does not workout. He denies chest pain, shortness of breath, dizziness.     He is on cholesterol medication and denies myalgias. His cholesterol is at goal. The cholesterol last visit was:   Lab Results  Component Value Date   CHOL 242 (H) 08/10/2016   HDL 50 08/10/2016   LDLCALC NOT CALC 08/10/2016   TRIG 448 (H) 08/10/2016   CHOLHDL 4.8 08/10/2016    He has been working on diet and exercise for prediabetes, and denies foot ulcerations, hyperglycemia, hypoglycemia , increased appetite, nausea, paresthesia of the feet, polydipsia, polyuria, visual disturbances, vomiting and weight loss. Last A1C in the office was:  Lab Results  Component Value Date   HGBA1C 5.2 08/10/2016   Patient is on Vitamin D supplement.  Lab Results  Component Value Date   VD25OH 22 (L) 08/10/2016     BMI is There is no height or weight on file to calculate BMI., he is working on diet and exercise. Wt Readings from Last 3 Encounters:  10/25/16 242 lb (109.8 kg)  10/23/16 249 lb (112.9 kg)  08/10/16 252 lb (114.3 kg)     Current Medications:  Current Outpatient Prescriptions on File Prior to Visit  Medication Sig Dispense Refill  . B-D 3CC LUER-LOK SYR 22GX1" 22G X 1" 3 ML MISC USE AS DIRECTED 100 each 3  . cholecalciferol (VITAMIN D) 1000 units  tablet Take 5,000 Units by mouth daily.    . ciprofloxacin (CIPRO) 500 MG tablet Take 1 tablet (500 mg total) by mouth 2 (two) times daily. One po bid x 7 days 6 tablet 0  . losartan (COZAAR) 50 MG tablet Take 1 tablet (50 mg total) by mouth daily. 90 tablet 1  . metroNIDAZOLE (FLAGYL) 500 MG tablet Take 1 tablet (500 mg total) by mouth 2 (two) times daily. One po bid x 7 days 6 tablet 0  . pantoprazole (PROTONIX) 40 MG tablet TAKE 1 TABLET BY MOUTH EVERY DAY 30 MINUTES BEFORE FOOD 90 tablet 1  . pravastatin (PRAVACHOL) 40 MG tablet Take 1 tablet (40 mg total) by mouth daily. 90 tablet 0  . sildenafil (REVATIO) 20 MG tablet Please take 50 mg to 100 mg as needed for ED 90 tablet 0  . testosterone cypionate (DEPOTESTOSTERONE CYPIONATE) 200 MG/ML injection INJECT IM EVERY 2 WEEKS (Patient taking differently: Inject 200 mg into the muscle every 21 ( twenty-one) days. ) 10 mL 5  . traMADol (ULTRAM) 50 MG tablet Take 1 tablet (50 mg total) by mouth every 6 (six) hours as needed. 60 tablet 0   No current facility-administered medications on file prior to visit.     Medical History:  Past Medical History:  Diagnosis Date  . Hyperlipidemia   . Hypertension   . Hypogonadism male   . OSA (obstructive sleep apnea)   .  Other testicular hypofunction   . Pre-diabetes   . Vitamin D deficiency disease     Allergies: No Known Allergies   Review of Systems:  Review of Systems  Constitutional: Negative for chills, fever and malaise/fatigue.  HENT: Negative for congestion, ear pain and sore throat.   Respiratory: Negative for cough, shortness of breath and wheezing.   Cardiovascular: Negative for chest pain, palpitations and leg swelling.  Gastrointestinal: Negative for abdominal pain, blood in stool, constipation, diarrhea, heartburn and melena.  Genitourinary: Negative.   Skin: Negative.   Neurological: Negative for dizziness, loss of consciousness and headaches.  Psychiatric/Behavioral:  Negative for depression. The patient is not nervous/anxious and does not have insomnia.     Family history- Review and unchanged  Social history- Review and unchanged  Physical Exam: There were no vitals taken for this visit. Wt Readings from Last 3 Encounters:  10/25/16 242 lb (109.8 kg)  10/23/16 249 lb (112.9 kg)  08/10/16 252 lb (114.3 kg)    General Appearance: Well nourished well developed, in no apparent distress. Eyes: PERRLA, EOMs, conjunctiva no swelling or erythema ENT/Mouth: Ear canals normal without obstruction, swelling, erythma, discharge.  TMs normal bilaterally.  Oropharynx moist, clear, without exudate, or postoropharyngeal swelling. Neck: Supple, thyroid normal,no cervical adenopathy  Respiratory: Respiratory effort normal, Breath sounds clear A&P without rhonchi, wheeze, or rale.  No retractions, no accessory usage. Cardio: RRR with no MRGs. Brisk peripheral pulses without edema.  Abdomen: Soft, + BS,  Non tender, no guarding, rebound, hernias, masses. Musculoskeletal: Full ROM, 5/5 strength, Normal gait Skin: Warm, dry without rashes, lesions, ecchymosis.  Neuro: Awake and oriented X 3, Cranial nerves intact. Normal muscle tone, no cerebellar symptoms. Psych: Normal affect, Insight and Judgment appropriate.    Quentin Mulling, PA-C 7:38 AM [facility]

## 2017-04-24 ENCOUNTER — Ambulatory Visit: Payer: Self-pay | Admitting: Physician Assistant

## 2017-05-08 ENCOUNTER — Ambulatory Visit: Payer: Self-pay | Admitting: Adult Health

## 2017-05-18 ENCOUNTER — Encounter: Payer: Self-pay | Admitting: Physician Assistant

## 2017-05-23 ENCOUNTER — Other Ambulatory Visit: Payer: Self-pay | Admitting: Physician Assistant

## 2017-05-24 ENCOUNTER — Other Ambulatory Visit: Payer: Self-pay

## 2017-05-24 MED ORDER — PRAVASTATIN SODIUM 40 MG PO TABS: 40.0000 mg | ORAL_TABLET | Freq: Every day | ORAL | 0 refills | Status: DC

## 2017-06-01 ENCOUNTER — Other Ambulatory Visit: Payer: Self-pay | Admitting: *Deleted

## 2017-06-01 MED ORDER — TESTOSTERONE CYPIONATE 200 MG/ML IM SOLN: INTRAMUSCULAR | 0 refills | Status: DC

## 2017-06-01 NOTE — Progress Notes (Signed)
FOLLOW UP  Assessment and Plan:   Hypertension Currently well controlled with current medications Monitor blood pressure at home; patient to call if consistently greater than 130/80 Continue DASH diet.   Reminder to go to the ER if any CP, SOB, nausea, dizziness, severe HA, changes vision/speech, left arm numbness and tingling and jaw pain.  Cholesterol Currently managed by lifestyle and medications Continue low cholesterol diet and exercise.  Check lipid panel.   History of elevated glucose Continue diet and exercise.  Perform daily foot/skin check, notify office of any concerning changes.  Check A1C  Obesity with co morbidities Long discussion about weight loss, diet, and exercise Discussed ideal weight for height (below 190)  But patient does not want to go below 200 lb, sets a initial weight goal (230) - his goal is to get off of cholesterol and BP medication Patient will work on cutting out pot pie, switching to whole wheat products.  Will follow up in 3 months  Vitamin D Def/ osteoporosis prevention Continue supplementation Check Vit D level  Gout Restart allopurinol; colchicine as needed Diet discussed Check uric acid   Continue diet and meds as discussed. Further disposition pending results of labs. Discussed med's effects and SE's.   Over 30 minutes of exam, counseling, chart review, and critical decision making was performed.   No future appointments. m ----------------------------------------------------------------------------------------------------------------------  HPI 56 y.o. male  presents for follow up on hypertension, cholesterol, history of elevated glucose, obesity and vitamin D deficiency. He has not been seen for chronic conditions since January and has run out of several medications including allopurinol and reports a flare of the left ankle and thumb a few months ago that did subside without treatment.   BMI is Body mass index is 34.44 kg/m.,  he has been working on diet and exercise.- he only eats fried foods on the weekends, cut out soda, significantly increased vegetables. He would like to lose 10 more pounds by the next visit. His job requires extensive walking.  Wt Readings from Last 3 Encounters:  06/02/17 240 lb (108.9 kg)  10/25/16 242 lb (109.8 kg)  10/23/16 249 lb (112.9 kg)   His blood pressure has been controlled at home, today their BP is BP: 128/82  He does not workout. He denies chest pain, shortness of breath, dizziness.   He is on cholesterol medication and denies myalgias. His cholesterol is not at goal. The cholesterol last visit was:   Lab Results  Component Value Date   CHOL 242 (H) 08/10/2016   HDL 50 08/10/2016   LDLCALC NOT CALC 08/10/2016   TRIG 448 (H) 08/10/2016   CHOLHDL 4.8 08/10/2016    He has been working on diet and exercise for prediabetes, and denies hyperglycemia, hypoglycemia , increased appetite, nausea, paresthesia of the feet, polydipsia, polyuria and visual disturbances. Last A1C in the office was:  Lab Results  Component Value Date   HGBA1C 5.2 08/10/2016   Patient is on Vitamin D supplement but below goal:   Lab Results  Component Value Date   VD25OH 22 (L) 08/10/2016     Patient is on allopurinol for gout and does report a recent flare while out of medications:  Lab Results  Component Value Date   LABURIC 6.8 03/16/2016   He has a history of testosterone deficiency and is on testosterone replacement. He states that the testosterone helps with his energy, libido, muscle mass. Lab Results  Component Value Date   TESTOSTERONE 296 08/10/2016  Current Medications:  Current Outpatient Prescriptions on File Prior to Visit  Medication Sig  . B-D 3CC LUER-LOK SYR 22GX1" 22G X 1" 3 ML MISC USE AS DIRECTED  . cholecalciferol (VITAMIN D) 1000 units tablet Take 5,000 Units by mouth daily.  Marland Kitchen losartan (COZAAR) 50 MG tablet Take 1 tablet (50 mg total) by mouth daily.  .  pantoprazole (PROTONIX) 40 MG tablet TAKE 1 TABLET BY MOUTH EVERY DAY 30 MINUTES BEFORE FOOD  . pravastatin (PRAVACHOL) 40 MG tablet Take 1 tablet (40 mg total) by mouth daily.  Marland Kitchen testosterone cypionate (DEPOTESTOSTERONE CYPIONATE) 200 MG/ML injection INJECT IM EVERY 2 WEEKS  . metroNIDAZOLE (FLAGYL) 500 MG tablet Take 1 tablet (500 mg total) by mouth 2 (two) times daily. One po bid x 7 days (Patient not taking: Reported on 06/02/2017)  . traMADol (ULTRAM) 50 MG tablet Take 1 tablet (50 mg total) by mouth every 6 (six) hours as needed. (Patient not taking: Reported on 06/02/2017)   No current facility-administered medications on file prior to visit.      Allergies: No Known Allergies   Medical History:  Past Medical History:  Diagnosis Date  . Hyperlipidemia   . Hypertension   . Hypogonadism male   . OSA (obstructive sleep apnea)   . Other testicular hypofunction   . Pre-diabetes   . Vitamin D deficiency disease    Family history- Reviewed and unchanged Social history- Reviewed and unchanged   Review of Systems:  Review of Systems  Constitutional: Negative for malaise/fatigue and weight loss.  HENT: Negative for hearing loss and tinnitus.   Eyes: Negative for blurred vision and double vision.  Respiratory: Negative for cough, shortness of breath and wheezing.   Cardiovascular: Negative for chest pain, palpitations, orthopnea, claudication and leg swelling.  Gastrointestinal: Negative for abdominal pain, blood in stool, constipation, diarrhea, heartburn, melena, nausea and vomiting.  Genitourinary: Negative.   Musculoskeletal: Negative for joint pain and myalgias.  Skin: Negative for rash.  Neurological: Negative for dizziness, tingling, sensory change, weakness and headaches.  Endo/Heme/Allergies: Negative for environmental allergies and polydipsia.  Psychiatric/Behavioral: Negative.   All other systems reviewed and are negative.   Physical Exam: BP 128/82   Pulse 71    Temp (!) 97.5 F (36.4 C)   Ht 5\' 10"  (1.778 m)   Wt 240 lb (108.9 kg)   SpO2 98%   BMI 34.44 kg/m  Wt Readings from Last 3 Encounters:  06/02/17 240 lb (108.9 kg)  10/25/16 242 lb (109.8 kg)  10/23/16 249 lb (112.9 kg)   General Appearance: Well nourished, in no apparent distress. Eyes: PERRLA, EOMs, conjunctiva no swelling or erythema Sinuses: No Frontal/maxillary tenderness ENT/Mouth: Ext aud canals clear, TMs without erythema, bulging. No erythema, swelling, or exudate on post pharynx.  Tonsils not swollen or erythematous. Hearing normal.  Neck: Supple, thyroid normal.  Respiratory: Respiratory effort normal, BS equal bilaterally without rales, rhonchi, wheezing or stridor.  Cardio: RRR with no MRGs. Brisk peripheral pulses without edema.  Abdomen: Soft, + BS.  Non tender, no guarding, rebound, hernias, masses. Lymphatics: Non tender without lymphadenopathy.  Musculoskeletal: Full ROM, 5/5 strength, Normal gait Skin: Warm, dry without rashes, lesions, ecchymosis.  Neuro: Cranial nerves intact. No cerebellar symptoms.  Psych: Awake and oriented X 3, normal affect, Insight and Judgment appropriate.    Dan Maker, NP 12:34 PM [facility]

## 2017-06-02 ENCOUNTER — Encounter: Payer: Self-pay | Admitting: Adult Health

## 2017-06-02 ENCOUNTER — Ambulatory Visit (INDEPENDENT_AMBULATORY_CARE_PROVIDER_SITE_OTHER): Payer: BLUE CROSS/BLUE SHIELD | Admitting: Adult Health

## 2017-06-02 VITALS — BP 128/82 | HR 71 | Temp 97.5°F | Ht 70.0 in | Wt 240.0 lb

## 2017-06-02 DIAGNOSIS — E291 Testicular hypofunction: Secondary | ICD-10-CM

## 2017-06-02 DIAGNOSIS — M109 Gout, unspecified: Secondary | ICD-10-CM | POA: Diagnosis not present

## 2017-06-02 DIAGNOSIS — Z79899 Other long term (current) drug therapy: Secondary | ICD-10-CM

## 2017-06-02 DIAGNOSIS — E559 Vitamin D deficiency, unspecified: Secondary | ICD-10-CM

## 2017-06-02 DIAGNOSIS — I10 Essential (primary) hypertension: Secondary | ICD-10-CM | POA: Diagnosis not present

## 2017-06-02 DIAGNOSIS — M1A0720 Idiopathic chronic gout, left ankle and foot, without tophus (tophi): Secondary | ICD-10-CM

## 2017-06-02 DIAGNOSIS — R7309 Other abnormal glucose: Secondary | ICD-10-CM | POA: Diagnosis not present

## 2017-06-02 DIAGNOSIS — M1A9XX0 Chronic gout, unspecified, without tophus (tophi): Secondary | ICD-10-CM

## 2017-06-02 DIAGNOSIS — Z6834 Body mass index (BMI) 34.0-34.9, adult: Secondary | ICD-10-CM

## 2017-06-02 DIAGNOSIS — Z8639 Personal history of other endocrine, nutritional and metabolic disease: Secondary | ICD-10-CM

## 2017-06-02 DIAGNOSIS — E6609 Other obesity due to excess calories: Secondary | ICD-10-CM

## 2017-06-02 DIAGNOSIS — E782 Mixed hyperlipidemia: Secondary | ICD-10-CM

## 2017-06-02 MED ORDER — ALLOPURINOL 300 MG PO TABS: 300.0000 mg | ORAL_TABLET | Freq: Every day | ORAL | 2 refills | Status: AC

## 2017-06-02 MED ORDER — SILDENAFIL CITRATE 100 MG PO TABS: ORAL_TABLET | ORAL | 2 refills | Status: AC

## 2017-06-02 MED ORDER — COLCHICINE 0.6 MG PO TABS: 0.6000 mg | ORAL_TABLET | Freq: Every day | ORAL | 2 refills | Status: AC

## 2017-06-02 MED ORDER — TESTOSTERONE CYPIONATE 200 MG/ML IM SOLN
200.0000 mg | Freq: Once | INTRAMUSCULAR | Status: AC
  Administered 2017-06-02: 200 mg via INTRAMUSCULAR

## 2017-06-02 NOTE — Patient Instructions (Addendum)
Take sildenafil prescription to [facility] - look up sildenafil 100 mg on goodrx.com and print the coupon for the best price. You can take 1/4-1/2 tab as needed.   Goal weight: 230 lb at next visit  We want weight loss that will last so you should lose 1-2 pounds a week.  THAT IS IT! Please pick THREE things a month to change. Once it is a habit check off the item. Then pick another three items off the list to become habits.  If you are already doing a habit on the list GREAT!  Cross that item off! o Don't drink your calories. Ie, alcohol, soda, fruit juice, and sweet tea.  o Drink more water. Drink a glass when you feel hungry or before each meal.  o Eat breakfast - Complex carb and protein (likeDannon light and fit yogurt, oatmeal, fruit, eggs, [place]key bacon). o Measure your cereal.  Eat no more than one cup a day. (ie [place]) o Eat an apple a day. o Add a vegetable a day. o Try a new vegetable a month. o Use Pam! Stop using oil or butter to cook. o Don't finish your plate or use smaller plates. o Share your dessert. o Eat sugar free Jello for dessert or frozen grapes. o Don't eat 2-3 hours before bed. o Switch to whole wheat bread, pasta, and brown rice. o Make healthier choices when you eat out. No fries! o Pick baked chicken, NOT fried. o Don't forget to SLOW DOWN when you eat. It is not going anywhere.  o Take the stairs. o Park far away in the parking lot o [employer] Farm[employer] soup cans (or weights) for 10 minutes while watching TV. o Walk at work for 10 minutes during break. o Walk outside 1 time a week with your friend, kids, dog, or significant other. o Start a walking group at church. o Walk the mall as much as you can tolerate.  o Keep a food diary. o Weigh yourself daily. o Walk for 15 minutes 3 days per week. o Cook at home more often and eat out less.  If life happens and you go back to old habits, it is okay.  Just start over. You can do it!   If you experience chest pain, get  short of breath, or tired during the exercise, please stop immediately and inform your doctor.

## 2017-06-03 ENCOUNTER — Other Ambulatory Visit: Payer: Self-pay | Admitting: Internal Medicine

## 2017-06-03 LAB — HEPATIC FUNCTION PANEL
AG RATIO: 1.6 (calc) (ref 1.0–2.5)
ALKALINE PHOSPHATASE (APISO): 78 U/L (ref 40–115)
ALT: 16 U/L (ref 9–46)
AST: 19 U/L (ref 10–35)
Albumin: 4.6 g/dL (ref 3.6–5.1)
BILIRUBIN TOTAL: 0.7 mg/dL (ref 0.2–1.2)
Bilirubin, Direct: 0.1 mg/dL (ref 0.0–0.2)
Globulin: 2.9 g/dL (calc) (ref 1.9–3.7)
Indirect Bilirubin: 0.6 mg/dL (calc) (ref 0.2–1.2)
Total Protein: 7.5 g/dL (ref 6.1–8.1)

## 2017-06-03 LAB — CBC WITH DIFFERENTIAL/PLATELET
BASOS ABS: 43 {cells}/uL (ref 0–200)
Basophils Relative: 0.6 %
EOS ABS: 128 {cells}/uL (ref 15–500)
EOS PCT: 1.8 %
HEMATOCRIT: 42.8 % (ref 38.5–50.0)
HEMOGLOBIN: 14.9 g/dL (ref 13.2–17.1)
LYMPHS ABS: 2272 {cells}/uL (ref 850–3900)
MCH: 31.1 pg (ref 27.0–33.0)
MCHC: 34.8 g/dL (ref 32.0–36.0)
MCV: 89.4 fL (ref 80.0–100.0)
MPV: 10 fL (ref 7.5–12.5)
Monocytes Relative: 6.6 %
NEUTROS ABS: 4189 {cells}/uL (ref 1500–7800)
Neutrophils Relative %: 59 %
Platelets: 255 10*3/uL (ref 140–400)
RBC: 4.79 10*6/uL (ref 4.20–5.80)
RDW: 11.8 % (ref 11.0–15.0)
Total Lymphocyte: 32 %
WBC: 7.1 10*3/uL (ref 3.8–10.8)
WBCMIX: 469 {cells}/uL (ref 200–950)

## 2017-06-03 LAB — BASIC METABOLIC PANEL WITH GFR
BUN: 17 mg/dL (ref 7–25)
CO2: 28 mmol/L (ref 20–32)
CREATININE: 1.29 mg/dL (ref 0.70–1.33)
Calcium: 9.7 mg/dL (ref 8.6–10.3)
Chloride: 102 mmol/L (ref 98–110)
GFR, EST NON AFRICAN AMERICAN: 61 mL/min/{1.73_m2} (ref >=60)
GFR, Est African American: 71 mL/min/{1.73_m2} (ref >=60)
Glucose, Bld: 86 mg/dL (ref 65–99)
Potassium: 4.8 mmol/L (ref 3.5–5.3)
SODIUM: 138 mmol/L (ref 135–146)

## 2017-06-03 LAB — LIPID PANEL
CHOL/HDL RATIO: 3.4 (calc) (ref <5.0)
Cholesterol: 193 mg/dL (ref <200)
HDL: 56 mg/dL (ref >40)
LDL CHOLESTEROL (CALC): 112 mg/dL — AB
NON-HDL CHOLESTEROL (CALC): 137 mg/dL — AB (ref <130)
TRIGLYCERIDES: 135 mg/dL (ref <150)

## 2017-06-03 LAB — HEMOGLOBIN A1C
EAG (MMOL/L): 6 (calc)
HEMOGLOBIN A1C: 5.4 %{Hb} (ref <5.7)
MEAN PLASMA GLUCOSE: 108 (calc)

## 2017-06-03 LAB — TSH: TSH: 0.79 mIU/L (ref 0.40–4.50)

## 2017-06-03 LAB — VITAMIN D 25 HYDROXY (VIT D DEFICIENCY, FRACTURES): Vit D, 25-Hydroxy: 55 ng/mL (ref 30–100)

## 2017-06-03 LAB — MAGNESIUM: Magnesium: 2.1 mg/dL (ref 1.5–2.5)

## 2017-06-03 LAB — TESTOSTERONE: Testosterone: 364 ng/dL (ref 250–827)

## 2017-06-03 LAB — URIC ACID: Uric Acid, Serum: 7.9 mg/dL (ref 4.0–8.0)

## 2017-06-21 ENCOUNTER — Encounter: Payer: Self-pay | Admitting: Internal Medicine

## 2017-06-28 ENCOUNTER — Other Ambulatory Visit: Payer: Self-pay | Admitting: Internal Medicine

## 2017-06-28 MED ORDER — PREDNISONE 20 MG PO TABS: ORAL_TABLET | ORAL | 0 refills | Status: AC

## 2017-07-23 ENCOUNTER — Other Ambulatory Visit: Payer: Self-pay | Admitting: Internal Medicine

## 2017-07-26 ENCOUNTER — Encounter: Payer: Self-pay | Admitting: Internal Medicine

## 2017-07-27 LAB — CBC
Basophils %: 1 %
Basophils Absolute: 0.1 /??L
Eosinophils %: 2.5 %
Eosinophils Absolute: 0.1 /??L
Hematocrit: 48.7 % (ref 41–53)
Hemoglobin: 15.9 g/dL (ref 13.5–17.5)
Lymphocytes %: 47.5 %
Lymphocytes Absolute: 2.8 /??L
Monocytes %: 7.8 %
Monocytes Absolute: 0.5 /??L
Neutrophils %: 41.2 %
Neutrophils Absolute: 2.4 /??L
Platelets: 247 K/??L
RBC: 5.12 10^6/??L
WBC: 5.9 10^3/mL

## 2017-07-27 LAB — COMPREHENSIVE METABOLIC PANEL
ALT: 32 U/L
AST: 23 U/L
Albumin: 4.8
Alkaline Phosphatase: 64 U/L
BUN: 9 mg/dL
CO2: 24 mmol/L
Calcium: 9.8 mg/dL
Chloride: 102 mmol/L
Creatinine: 0.9
Glucose: 108 mg/dL
Potassium: 4.5 mmol/L
Sodium: 142 mmol/L
Total Bilirubin: 0.5 mg/dL (ref 0.1–1.4)
Total Protein: 6.9

## 2017-07-27 LAB — LIPID PANEL
Cholesterol, Total: 184 mg/dL
HDL: 62 mg/dL (ref 35–70)
LDL Calculated: 94 mg/dL (ref 0–160)
Triglycerides: 141 mg/dL
VLDL: 28 mg/dL

## 2017-08-10 ENCOUNTER — Encounter: Payer: Self-pay | Admitting: Internal Medicine

## 2017-08-11 ENCOUNTER — Other Ambulatory Visit: Payer: Self-pay | Admitting: Internal Medicine

## 2017-08-23 ENCOUNTER — Other Ambulatory Visit: Payer: Self-pay | Admitting: Physician Assistant

## 2017-08-28 ENCOUNTER — Other Ambulatory Visit: Payer: Self-pay

## 2017-08-28 MED ORDER — PANTOPRAZOLE SODIUM 40 MG PO TBEC: DELAYED_RELEASE_TABLET | ORAL | 0 refills | Status: DC

## 2017-08-30 ENCOUNTER — Other Ambulatory Visit: Payer: Self-pay | Admitting: *Deleted

## 2017-08-30 MED ORDER — PANTOPRAZOLE SODIUM 40 MG PO TBEC: DELAYED_RELEASE_TABLET | ORAL | 0 refills | Status: DC

## 2017-08-31 ENCOUNTER — Other Ambulatory Visit: Payer: Self-pay

## 2017-08-31 MED ORDER — PRAVASTATIN SODIUM 40 MG PO TABS: 40.0000 mg | ORAL_TABLET | Freq: Every day | ORAL | 0 refills | Status: DC

## 2017-08-31 NOTE — Telephone Encounter (Signed)
Pharmacy requesting 90 day supply of Pravastatin.

## 2017-09-05 ENCOUNTER — Encounter: Payer: Self-pay | Admitting: Adult Health

## 2017-09-05 DIAGNOSIS — N182 Chronic kidney disease, stage 2 (mild): Secondary | ICD-10-CM | POA: Insufficient documentation

## 2017-09-05 NOTE — Progress Notes (Signed)
Complete Physical  Assessment and Plan:  Diagnoses and all orders for this visit:  Encounter for routine adult physical exam with abnormal findings  Essential hypertension At goal; continue medications Monitor blood pressure at home; call if consistently over 130/80 Continue DASH diet.   Reminder to go to the ER if any CP, SOB, nausea, dizziness, severe HA, changes vision/speech, left arm numbness and tingling and jaw pain -     EKG 12-Lead  OSA (obstructive sleep apnea) Has CPAP; successfully lost 20 lb and reports snoring has resolved.   Hypogonadism male - continue to monitor, states medication is helping with symptoms of low T.   Idiopathic chronic gout of left ankle without tophus Continue allopurinol Diet discussed Check uric acid as needed - defer today as checked recently   Erectile dysfunction, unspecified erectile dysfunction type Recommended weight loss, start ASA Close monitoring of BP, cholesterol, blood sugar continue cialis as needed  Mixed hyperlipidemia At goal; continue statin medications Continue low cholesterol diet and exercise.  -     Lipid panel -     TSH  History of elevated glucose Discussed disease and risks Discussed diet/exercise, weight management  -     Hemoglobin A1c  Vitamin D deficiency disease -     VITAMIN D 25 Hydroxy (Vit-D Deficiency, Fractures)  Class 1 obesity due to excess calories with serious comorbidity and body mass index (BMI) of 34.0 to 34.9 in adult Long discussion about weight loss, diet, and exercise Recommended diet heavy in fruits and veggies and low in animal meats, cheeses, and dairy products, appropriate calorie intake Discussed appropriate weight for height (below 183 lb)  Follow up at next visit  Medication management -     CBC with Differential/Platelet -     BASIC METABOLIC PANEL WITH GFR -     Hepatic function panel  CKD (chronic kidney disease), symptom management only, stage 2 (mild) Increase  fluids, avoid NSAIDS, monitor sugars, will monitor -     BASIC METABOLIC PANEL WITH GFR  Screening for hematuria or proteinuria -     Urinalysis w microscopic + reflex cultur -     Microalbumin / creatinine urine ratio  Screening for malignant neoplasm of prostate -     PSA  Bilateral shoulder strain Likely simple strain of muscles Apply heat, gentle stretching, avoid aggravating movements May take 6-12 weeks to fully recover Motrin, flexeril - if not helping may call back for prednisone  Discussed med's effects and SE's. Screening labs and tests as requested with regular follow-up as recommended. Over 40 minutes of exam, counseling, chart review and critical decision making was performed  Future Appointments  Date Time Provider Department Center  12/07/2017  4:30 PM Judd Gaudierorbett, Tryphena Perkovich, NP [facility] None  09/06/2018  3:00 PM Judd Gaudierorbett, Athena Baltz, NP [facility] None     HPI 57 y/o AA male patient presents for a complete physical. has Essential hypertension; Mixed hyperlipidemia; History of elevated glucose; Vitamin D deficiency disease; Hypogonadism male; OSA (obstructive sleep apnea); Obesity; ED (erectile dysfunction); Gout of left ankle; Medication management; Noncompliance; CKD (chronic kidney disease), symptom management only, stage 2 (mild); and GERD (gastroesophageal reflux disease) on their problem list. He also c/o bilateral shoulder pain - began in the right - he reports he was on vacation over Christmas break for two weeks then noted tightness/pain when he returned to work. He works at a [employer] lifting a lot. Notes the pain keeps him up at night. He has been trying ibuprofen - which  is somewhat helpful.   Has CPAP - but has not needed since loosing 20 lb.   BMI is Body mass index is 34.29 kg/m., he has been working on diet and exercise. He has been taking apple cider vinegar pills. Wt Readings from Last 3 Encounters:  09/06/17 239 lb (108.4 kg)  06/02/17 240 lb (108.9 kg)   10/25/16 242 lb (109.8 kg)   His blood pressure has been controlled at home (until recently with shoulder pain), today their BP is BP: 140/90 He does workout. He denies chest pain, shortness of breath, dizziness.   He is on cholesterol medication and denies myalgias. His cholesterol is at goal. The cholesterol last visit was:  Lab Results  Component Value Date   CHOL 193 06/02/2017   HDL 56 06/02/2017   LDLCALC NOT CALC 08/10/2016   TRIG 135 06/02/2017   CHOLHDL 3.4 06/02/2017   He has been working on diet and exercise for history of prediabetes, he is not on bASA, he is on ACE/ARB and denies increased appetite, nausea, paresthesia of the feet, polydipsia, polyuria, visual disturbances, vomiting and weight loss. Last A1C in the office was:  Lab Results  Component Value Date   HGBA1C 5.4 06/02/2017   Last GFR:  Lab Results  Component Value Date   GFRAA 71 06/02/2017   Patient is on Vitamin D supplement but below goal of 70:   Lab Results  Component Value Date   VD25OH 55 06/02/2017     Last PSA was: Lab Results  Component Value Date   PSA 0.4 08/10/2016   Patient is on allopurinol for gout and does not report a recent flare.  Lab Results  Component Value Date   LABURIC 7.9 06/02/2017   He has a history of testosterone deficiency and is on testosterone replacement. He states that the testosterone helps with his energy, libido, muscle mass. Lab Results  Component Value Date   TESTOSTERONE 364 06/02/2017     Current Medications:  Current Outpatient Medications on File Prior to Visit  Medication Sig Dispense Refill  . allopurinol (ZYLOPRIM) 300 MG tablet Take 1 tablet (300 mg total) by mouth daily. 90 tablet 2  . B-D 3CC LUER-LOK SYR 22GX1" 22G X 1" 3 ML MISC USE AS DIRECTED 100 each 3  . cholecalciferol (VITAMIN D) 1000 units tablet Take 5,000 Units by mouth daily.    . colchicine 0.6 MG tablet Take 1 tablet (0.6 mg total) by mouth daily. 30 tablet 2  . losartan  (COZAAR) 50 MG tablet Take 1 tablet (50 mg total) by mouth daily. 90 tablet 1  . pantoprazole (PROTONIX) 40 MG tablet TAKE 1 TABLET BY MOUTH EVERY DAY 30 MINUTES BEFORE FOOD 45 tablet 0  . pravastatin (PRAVACHOL) 40 MG tablet Take 1 tablet (40 mg total) by mouth daily. 90 tablet 0  . predniSONE (DELTASONE) 20 MG tablet 1 tab 3 x day for 2 days, then 1 tab 2 x day for 2 days, then 1 tab 1 x day for 3 days 13 tablet 0  . sildenafil (VIAGRA) 100 MG tablet Take 1/4-1/2 tab as needed 1 hour prior 20 tablet 2  . testosterone cypionate (DEPOTESTOSTERONE CYPIONATE) 200 MG/ML injection INJECT EVERY 2 WEEKS 10 mL 0   No current facility-administered medications on file prior to visit.    Allergies:  No Known Allergies Health Maintenance:  Immunization History  Administered Date(s) Administered  . Tdap 01/23/2014   Tetanus: 2015 Pneumovax: n/a Prevnar 13: n/a Flu vaccine:  DUE - refuses Zostavax: not available in office -   Colonoscopy: 08/2015 EGD: 01/2010  Eye Exam: 2018 Dentist: Several years ago - encouraged to follow up   Patient Care Team: Lucky Cowboy, MD as PCP - General (Internal Medicine) Charna Elizabeth, MD as Consulting Physician (Gastroenterology)  Medical History:  has Essential hypertension; Mixed hyperlipidemia; History of elevated glucose; Vitamin D deficiency disease; Hypogonadism male; OSA (obstructive sleep apnea); Obesity; ED (erectile dysfunction); Gout of left ankle; Medication management; Noncompliance; CKD (chronic kidney disease), symptom management only, stage 2 (mild); and GERD (gastroesophageal reflux disease) on their problem list. Surgical History:  He  has a past surgical history that includes Rigid esophagoscopy (N/A, 04/10/2016) and ORIF patella (Left, 1998). Family History:  His family history is not on file. Social History:   reports that he quit smoking about 6 years ago. He started smoking about 43 years ago. He has a 36.00 pack-year smoking history.  he has never used smokeless tobacco. He reports that he drinks alcohol. He reports that he does not use drugs. Review of Systems:  Review of Systems  Constitutional: Negative for malaise/fatigue and weight loss.  HENT: Negative for hearing loss and tinnitus.   Eyes: Negative for blurred vision and double vision.  Respiratory: Negative for cough, shortness of breath and wheezing.   Cardiovascular: Negative for chest pain, palpitations, orthopnea, claudication and leg swelling.  Gastrointestinal: Negative for abdominal pain, blood in stool, constipation, diarrhea, heartburn, melena, nausea and vomiting.  Genitourinary: Negative.   Musculoskeletal: Positive for joint pain (Bilateral shoulder pain/stiffness without injury x 3 weeks). Negative for myalgias.  Skin: Negative for rash.  Neurological: Negative for dizziness, tingling, sensory change, weakness and headaches.  Endo/Heme/Allergies: Negative for polydipsia.  Psychiatric/Behavioral: Negative for depression, memory loss and substance abuse. The patient has insomnia (With shoulder pain). The patient is not nervous/anxious.   All other systems reviewed and are negative.   Physical Exam: Estimated body mass index is 34.29 kg/m as calculated from the following:   Height as of this encounter: 5\' 10"  (1.778 m).   Weight as of this encounter: 239 lb (108.4 kg). BP 140/90   Pulse 64   Temp (!) 97.5 F (36.4 C)   Ht 5\' 10"  (1.778 m)   Wt 239 lb (108.4 kg)   SpO2 99%   BMI 34.29 kg/m  General Appearance: Well nourished, in no apparent distress.  Eyes: PERRLA, EOMs, conjunctiva no swelling or erythema, normal fundi and vessels.  Sinuses: No Frontal/maxillary tenderness  ENT/Mouth: Ext aud canals clear, normal light reflex with TMs without erythema, bulging. Good dentition. No erythema, swelling, or exudate on post pharynx. Tonsils not swollen or erythematous. Hearing normal.  Neck: Supple, thyroid normal. No bruits  Respiratory:  Respiratory effort normal, BS equal bilaterally without rales, rhonchi, wheezing or stridor.  Cardio: RRR without murmurs, rubs or gallops. Brisk peripheral pulses without edema.  Chest: symmetric, with normal excursions and percussion.  Abdomen: Soft, nontender, no guarding, rebound, hernias, masses, or organomegaly.  Lymphatics: Non tender without lymphadenopathy.  Genitourinary: Defer- no concerns Musculoskeletal: Full ROM all peripheral extremities,5/5 strength, and normal gait. Some generalized muscular tenderness to bilateral triceps, deltoids, traps with spasms Skin: Warm, dry without rashes, lesions, ecchymosis. Neuro: Cranial nerves intact, reflexes equal bilaterally. Normal muscle tone, no cerebellar symptoms. Sensation intact.  Psych: Awake and oriented X 3, normal affect, Insight and Judgment appropriate.   EKG: WNL no changes.  Carlyon Shadow Kyen Taite 4:27 PM [facility]

## 2017-09-06 ENCOUNTER — Encounter: Payer: Self-pay | Admitting: Adult Health

## 2017-09-06 ENCOUNTER — Ambulatory Visit: Payer: BLUE CROSS/BLUE SHIELD | Admitting: Adult Health

## 2017-09-06 VITALS — BP 140/90 | HR 64 | Temp 97.5°F | Ht 70.0 in | Wt 239.0 lb

## 2017-09-06 DIAGNOSIS — Z136 Encounter for screening for cardiovascular disorders: Secondary | ICD-10-CM

## 2017-09-06 DIAGNOSIS — Z1389 Encounter for screening for other disorder: Secondary | ICD-10-CM

## 2017-09-06 DIAGNOSIS — M62838 Other muscle spasm: Secondary | ICD-10-CM

## 2017-09-06 DIAGNOSIS — M25511 Pain in right shoulder: Secondary | ICD-10-CM

## 2017-09-06 DIAGNOSIS — Z0000 Encounter for general adult medical examination without abnormal findings: Secondary | ICD-10-CM | POA: Diagnosis not present

## 2017-09-06 DIAGNOSIS — Z125 Encounter for screening for malignant neoplasm of prostate: Secondary | ICD-10-CM | POA: Diagnosis not present

## 2017-09-06 DIAGNOSIS — E559 Vitamin D deficiency, unspecified: Secondary | ICD-10-CM

## 2017-09-06 DIAGNOSIS — K219 Gastro-esophageal reflux disease without esophagitis: Secondary | ICD-10-CM | POA: Insufficient documentation

## 2017-09-06 DIAGNOSIS — E291 Testicular hypofunction: Secondary | ICD-10-CM

## 2017-09-06 DIAGNOSIS — Z0001 Encounter for general adult medical examination with abnormal findings: Secondary | ICD-10-CM

## 2017-09-06 DIAGNOSIS — Z6834 Body mass index (BMI) 34.0-34.9, adult: Secondary | ICD-10-CM

## 2017-09-06 DIAGNOSIS — Z79899 Other long term (current) drug therapy: Secondary | ICD-10-CM

## 2017-09-06 DIAGNOSIS — N529 Male erectile dysfunction, unspecified: Secondary | ICD-10-CM

## 2017-09-06 DIAGNOSIS — E782 Mixed hyperlipidemia: Secondary | ICD-10-CM

## 2017-09-06 DIAGNOSIS — I10 Essential (primary) hypertension: Secondary | ICD-10-CM | POA: Diagnosis not present

## 2017-09-06 DIAGNOSIS — N182 Chronic kidney disease, stage 2 (mild): Secondary | ICD-10-CM

## 2017-09-06 DIAGNOSIS — E6609 Other obesity due to excess calories: Secondary | ICD-10-CM

## 2017-09-06 DIAGNOSIS — M25512 Pain in left shoulder: Secondary | ICD-10-CM

## 2017-09-06 DIAGNOSIS — E66811 Obesity, class 1: Secondary | ICD-10-CM

## 2017-09-06 DIAGNOSIS — Z8639 Personal history of other endocrine, nutritional and metabolic disease: Secondary | ICD-10-CM

## 2017-09-06 DIAGNOSIS — M1A0720 Idiopathic chronic gout, left ankle and foot, without tophus (tophi): Secondary | ICD-10-CM

## 2017-09-06 DIAGNOSIS — G4733 Obstructive sleep apnea (adult) (pediatric): Secondary | ICD-10-CM

## 2017-09-06 MED ORDER — RANITIDINE HCL 150 MG PO TABS: 150.0000 mg | ORAL_TABLET | Freq: Two times a day (BID) | ORAL | 1 refills | Status: AC

## 2017-09-06 MED ORDER — CYCLOBENZAPRINE HCL 5 MG PO TABS: 5.0000 mg | ORAL_TABLET | Freq: Three times a day (TID) | ORAL | 0 refills | Status: DC | PRN

## 2017-09-06 MED ORDER — MELOXICAM 15 MG PO TABS: ORAL_TABLET | ORAL | 1 refills | Status: DC

## 2017-09-06 NOTE — Patient Instructions (Addendum)
Take mobic once daily, rest/avoid straining movements for your shoulders, okay to do gentle stretching.   Flexeril 5 mg three times a day for muscle spasms - can cause drowsiness. Take at night first to see how drowsy this makes you, don't drive or operate heavy machinery on this medication until you know how you will react.   Try applying heat to strained muscle several times a day - avoid straining motions, can initiate gentle stretching in a few days   Can also take prescribed mobic (do not take with ibuprofen or aleve) or  tylenol for pain - take NSAIDS with food or milk to prevent stomach irritation.  Occupational therapy for dry needling  - can also look into medical massage  Follow up for any new severe HA/vision changes, dizziness, numbness/tingling of upper extremities     Aim for 7+ servings of fruits and vegetables daily  80+ fluid ounces of water or unsweet tea for healthy kidneys  1-2 drink of alcohol per day  Limit animal fats in diet for cholesterol and heart health - choose grass fed whenever available  Aim for low stress - take time to unwind and care for your mental health  Aim for 150 min of moderate intensity exercise weekly for heart health, and weights twice weekly for bone health  Aim for 7-9 hours of sleep daily    GETTING OFF OF PPI's    Nexium/protonix/prilosec/Omeprazole/Dexilant/Aciphex are called PPI's, they are great at healing your stomach but should only be taken for a short period of time.     Recent studies have shown that taken for a long time they  can increase the risk of osteoporosis (weakening of your bones), pneumonia, low magnesium, restless legs, Cdiff (infection that causes diarrhea), DEMENTIA and most recently kidney damage / disease / insufficiency.     Due to this information we want to try to stop the PPI but if you try to stop it abruptly this can cause rebound acid and worsening symptoms.   So this is how we want you to get off  the PPI: Generic is always fine!!  - Start taking the nexium/protonix/prilosec/PPI  every other day with  zantac (ranitidine) OR pepcid famotadine 2 x a day for 2-4 weeks - some people stay on this dosage and can not taper off further. Our main goal is to limit the dosage and amount you are taking so if you need to stay on this dose.   - then decrease the PPI to every 3 days while taking the zantac or pepcid 300mg  twice a day the other  days for 2-4  Weeks  - then you can try the zantac or pepcid 300mg  once at night or up to 2 x day as needed.  - you can continue on this once at night or stop all together  - Avoid alcohol, spicy foods, NSAIDS (aleve, ibuprofen) at this time. See foods below.   +++++++++++++++++++++++++++++++++++++++++++  Food Choices for Gastroesophageal Reflux Disease  When you have gastroesophageal reflux disease (GERD), the foods you eat and your eating habits are very important. Choosing the right foods can help ease the discomfort of GERD. WHAT GENERAL GUIDELINES DO I NEED TO FOLLOW?  Choose fruits, vegetables, whole grains, low-fat dairy products, and low-fat meat, fish, and poultry.  Limit fats such as oils, salad dressings, butter, nuts, and avocado.  Keep a food diary to identify foods that cause symptoms.  Avoid foods that cause reflux. These may be different for different people.  Eat frequent small meals instead of three large meals each day.  Eat your meals slowly, in a relaxed setting.  Limit fried foods.  Cook foods using methods other than frying.  Avoid drinking alcohol.  Avoid drinking large amounts of liquids with your meals.  Avoid bending over or lying down until 2-3 hours after eating.   WHAT FOODS ARE NOT RECOMMENDED? The following are some foods and drinks that may worsen your symptoms:  Vegetables Tomatoes. Tomato juice. Tomato and spaghetti sauce. Chili peppers. Onion and garlic. Horseradish. Fruits Oranges, grapefruit, and  lemon (fruit and juice). Meats High-fat meats, fish, and poultry. This includes hot dogs, ribs, ham, sausage, salami, and bacon. Dairy Whole milk and chocolate milk. Sour cream. Cream. Butter. Ice cream. Cream cheese.  Beverages Coffee and tea, with or without caffeine. Carbonated beverages or energy drinks. Condiments Hot sauce. Barbecue sauce.  Sweets/Desserts Chocolate and cocoa. Donuts. Peppermint and spearmint. Fats and Oils High-fat foods, including Jamaica[place] fries and potato chips. Other Vinegar. Strong spices, such as black pepper, white pepper, red pepper, cayenne, curry powder, cloves, ginger, and chili powder.

## 2017-09-07 LAB — CBC WITH DIFFERENTIAL/PLATELET
BASOS ABS: 49 {cells}/uL (ref 0–200)
Basophils Relative: 0.5 %
EOS PCT: 1.3 %
Eosinophils Absolute: 126 cells/uL (ref 15–500)
HCT: 48.2 % (ref 38.5–50.0)
Hemoglobin: 17 g/dL (ref 13.2–17.1)
Lymphs Abs: 2406 cells/uL (ref 850–3900)
MCH: 31.5 pg (ref 27.0–33.0)
MCHC: 35.3 g/dL (ref 32.0–36.0)
MCV: 89.4 fL (ref 80.0–100.0)
MONOS PCT: 6.4 %
MPV: 9.8 fL (ref 7.5–12.5)
NEUTROS PCT: 67 %
Neutro Abs: 6499 cells/uL (ref 1500–7800)
PLATELETS: 248 10*3/uL (ref 140–400)
RBC: 5.39 10*6/uL (ref 4.20–5.80)
RDW: 11.9 % (ref 11.0–15.0)
Total Lymphocyte: 24.8 %
WBC mixed population: 621 cells/uL (ref 200–950)
WBC: 9.7 10*3/uL (ref 3.8–10.8)

## 2017-09-07 LAB — HEPATIC FUNCTION PANEL
AG Ratio: 1.6 (calc) (ref 1.0–2.5)
ALKALINE PHOSPHATASE (APISO): 91 U/L (ref 40–115)
ALT: 16 U/L (ref 9–46)
AST: 17 U/L (ref 10–35)
Albumin: 4.4 g/dL (ref 3.6–5.1)
BILIRUBIN TOTAL: 0.5 mg/dL (ref 0.2–1.2)
Bilirubin, Direct: 0.1 mg/dL (ref 0.0–0.2)
Globulin: 2.8 g/dL (calc) (ref 1.9–3.7)
Indirect Bilirubin: 0.4 mg/dL (calc) (ref 0.2–1.2)
TOTAL PROTEIN: 7.2 g/dL (ref 6.1–8.1)

## 2017-09-07 LAB — BASIC METABOLIC PANEL WITH GFR
BUN: 17 mg/dL (ref 7–25)
CO2: 29 mmol/L (ref 20–32)
Calcium: 9.7 mg/dL (ref 8.6–10.3)
Chloride: 103 mmol/L (ref 98–110)
Creat: 1.23 mg/dL (ref 0.70–1.33)
GFR, Est African American: 75 mL/min/{1.73_m2} (ref >=60)
GFR, Est Non African American: 65 mL/min/{1.73_m2} (ref >=60)
GLUCOSE: 85 mg/dL (ref 65–99)
POTASSIUM: 4.1 mmol/L (ref 3.5–5.3)
SODIUM: 138 mmol/L (ref 135–146)

## 2017-09-07 LAB — LIPID PANEL
Cholesterol: 168 mg/dL (ref <200)
HDL: 54 mg/dL (ref >40)
LDL Cholesterol (Calc): 96 mg/dL (calc)
Non-HDL Cholesterol (Calc): 114 mg/dL (calc) (ref <130)
TRIGLYCERIDES: 88 mg/dL (ref <150)
Total CHOL/HDL Ratio: 3.1 (calc) (ref <5.0)

## 2017-09-07 LAB — MICROALBUMIN / CREATININE URINE RATIO
Creatinine, Urine: 169 mg/dL (ref 20–320)
Microalb Creat Ratio: 2 mcg/mg creat (ref <30)
Microalb, Ur: 0.3 mg/dL

## 2017-09-07 LAB — PSA: PSA: 0.3 ng/mL (ref <=4.0)

## 2017-09-07 LAB — URINALYSIS W MICROSCOPIC + REFLEX CULTURE
BILIRUBIN URINE: NEGATIVE
Bacteria, UA: NONE SEEN /HPF
GLUCOSE, UA: NEGATIVE
Hgb urine dipstick: NEGATIVE
Hyaline Cast: NONE SEEN /LPF
KETONES UR: NEGATIVE
LEUKOCYTE ESTERASE: NEGATIVE
NITRITES URINE, INITIAL: NEGATIVE
Protein, ur: NEGATIVE
RBC / HPF: NONE SEEN /HPF (ref 0–2)
SPECIFIC GRAVITY, URINE: 1.023 (ref 1.001–1.03)
Squamous Epithelial / LPF: NONE SEEN /HPF (ref <=5)
WBC, UA: NONE SEEN /HPF (ref 0–5)
pH: 6.5 (ref 5.0–8.0)

## 2017-09-07 LAB — HEMOGLOBIN A1C
Hgb A1c MFr Bld: 5.4 % of total Hgb (ref <5.7)
MEAN PLASMA GLUCOSE: 108 (calc)
eAG (mmol/L): 6 (calc)

## 2017-09-07 LAB — TSH: TSH: 1.2 mIU/L (ref 0.40–4.50)

## 2017-09-07 LAB — NO CULTURE INDICATED

## 2017-09-07 LAB — VITAMIN D 25 HYDROXY (VIT D DEFICIENCY, FRACTURES): Vit D, 25-Hydroxy: 70 ng/mL (ref 30–100)

## 2017-09-08 ENCOUNTER — Other Ambulatory Visit: Payer: Self-pay

## 2017-09-08 MED ORDER — LOSARTAN POTASSIUM 50 MG PO TABS: 50.0000 mg | ORAL_TABLET | Freq: Every day | ORAL | 1 refills | Status: DC

## 2017-09-11 ENCOUNTER — Other Ambulatory Visit: Payer: Self-pay

## 2017-09-11 MED ORDER — LOSARTAN POTASSIUM 50 MG PO TABS: 50.0000 mg | ORAL_TABLET | Freq: Every day | ORAL | 1 refills | Status: AC

## 2017-09-28 ENCOUNTER — Other Ambulatory Visit: Payer: Self-pay

## 2017-09-28 DIAGNOSIS — M25511 Pain in right shoulder: Secondary | ICD-10-CM

## 2017-09-28 DIAGNOSIS — M25512 Pain in left shoulder: Principal | ICD-10-CM

## 2017-09-28 MED ORDER — MELOXICAM 15 MG PO TABS: ORAL_TABLET | ORAL | 0 refills | Status: AC

## 2017-10-09 ENCOUNTER — Other Ambulatory Visit: Payer: Self-pay

## 2017-10-09 DIAGNOSIS — M9902 Segmental and somatic dysfunction of thoracic region: Secondary | ICD-10-CM | POA: Diagnosis not present

## 2017-10-09 DIAGNOSIS — M9901 Segmental and somatic dysfunction of cervical region: Secondary | ICD-10-CM | POA: Diagnosis not present

## 2017-10-09 DIAGNOSIS — M5032 Other cervical disc degeneration, mid-cervical region, unspecified level: Secondary | ICD-10-CM | POA: Diagnosis not present

## 2017-10-09 DIAGNOSIS — M5417 Radiculopathy, lumbosacral region: Secondary | ICD-10-CM | POA: Diagnosis not present

## 2017-10-09 MED ORDER — PANTOPRAZOLE SODIUM 40 MG PO TBEC: DELAYED_RELEASE_TABLET | ORAL | 1 refills | Status: DC

## 2017-10-10 ENCOUNTER — Ambulatory Visit
Admit: 2017-10-10 | Discharge: 2017-10-10 | Payer: PRIVATE HEALTH INSURANCE | Attending: Cardiovascular Disease | Primary: Family Medicine

## 2017-10-10 DIAGNOSIS — R002 Palpitations: Secondary | ICD-10-CM

## 2017-10-10 NOTE — Progress Notes (Signed)
CARDIOLOGY NOTE      10/10/2017    RE: Carl Castro  ([birth year])                               TO:  Dr.  Nathen May, MD            CHIEF COMPLAINT   Carl Castro is a 57 y.o. male who was seen today for management of  htn                                    HPI:   Patient is here for    - Hypertension,is  well controlled, pt is  compliant with medicines  - Hyperlipidimea, lipids are in acceptable range. Pt  is  compliant with medicines    - arrythmias  NO ISSUES                The patient does not have cardiac complaints    Carl Castro has the following history recorded in care path:  Patient Active Problem List    Diagnosis Date Noted   ??? Palpitations    ??? Frequent PVCs    ??? Swelling 11/15/2011   ??? H/O 24 hour EKG monitoring 11/03/2011   ??? V tach (HCC) 10/06/2011   ??? Hyperlipidemia    ??? Tachycardia      Current Outpatient Medications   Medication Sig Dispense Refill   ??? aspirin 81 MG tablet Take 81 mg by mouth daily.     ??? rosuvastatin (CRESTOR) 5 MG tablet Take 5 mg by mouth daily.       No current facility-administered medications for this visit.      Allergies: Patient has no known allergies.  Past Medical History:   Diagnosis Date   ??? Atypical chest pain    ??? Family history of early CAD    ??? Frequent PVCs    ??? H/O 24 hour EKG monitoring 11/03/11    48 hour- the predominant rhythm is sinus rhythm with one 10-beat run of nonsustained ventricular tachycardia, the patient is already being referred for EP study to Dr. Dianah Field in [place]   ??? H/O cardiovascular stress test 09/15/2011    09/15/2011-Normal study. No ischemia. LVSF normal. EF 63%.    ??? H/O echocardiogram 09/15/2011    09/15/2011- LVSF normal. EF 55%.Impaired LV relaxation. Mild TR.-report in epic   ??? History of complete ECG 09/08/2011   ??? Hyperlipidemia    ??? Palpitations    ??? S/P cardiac cath 11/11/2011    normal coronaries. reffer for EP study   ??? S/P radiofrequency ablation operation for arrhythmia    ??? Tachycardia      No past surgical  history on file.   As reviewed   Family History   Problem Relation Age of Onset   ??? Heart Disease Father         CMP A-fib     Social History     Tobacco Use   ??? Smoking status: Current Some Day Smoker     Types: Cigars   ??? Smokeless tobacco: Former Neurosurgeon   ??? Tobacco comment: occ   Substance Use Topics   ??? Alcohol use: Yes     Alcohol/week: 2.4 oz     Types: 4 Cans of beer per week      Review of Systems:  Constitutional: Negative for diaphoresis and fatigue  Psychological:Negative for anxiety or depression  HENT: Negative for headaches, nasal congestion, sinus pain or vertigo  Eyes: Negative for visual disturbance.   Endocrine: Negative for polydipsia/polyuria  Respiratory: Negative for shortness of breath  Cardiovascular: Negative for chest pain, dyspnea on exertion, claudication, edema, irregular heartbeat, murmur, palpitations or shortness of breath  Gastrointestinal: Negative for abdominal pain or heartburn  Genito-Urinary: Negative for urinary frequency/urgency  Musculoskeletal: Negative for muscle pain, muscular weakness, negative for pain in arm and leg or swelling in foot and leg  Neurological: Negative for dizziness, headaches, memory loss, numbness/tingling, visual changes, syncope  Dermatological: Negative for rash    Objective:  BP 112/80    Pulse 68    Ht 6\' 3"  (1.905 m)    Wt 248 lb 3.2 oz (112.6 kg)    BMI 31.02 kg/m??   Wt Readings from Last 3 Encounters:   10/10/17 248 lb 3.2 oz (112.6 kg)   10/12/16 247 lb 11.2 oz (112.4 kg)   10/05/15 248 lb (112.5 kg)     Body mass index is 31.02 kg/m??.  GENERAL - Alert, oriented, pleasant, in no apparent distress.  EYES: No jaundice, no conjunctival pallor.  SKIN: It is warm & dry. No rashes. No Echhymosis    HEENT - No clinically significant abnormalities seen.  Neck - Supple.  No jugular venous distention noted. No carotid bruits.   Cardiovascular - Normal S1 and S2 without obvious murmur or gallop.    Extremities - No cyanosis, clubbing, or significant  edema.    Pulmonary - No respiratory distress.  No wheezes or rales.    Abdomen - No masses, tenderness, or organomegaly.  Musculoskeletal - No significant edema. No joint deformities. No muscle wasting.  Neurologic - Cranial nerves II through XII are grossly intact.  There were no gross focal neurologic abnormalities.    Lab Review   No results found for: CKTOTAL, CKMB, CKMBINDEX, TROPONINT  BNP:  No results found for: BNP  PT/INR:    Lab Results   Component Value Date    INR 0.99 11/11/2011     No results found for: LABA1C  Lab Results   Component Value Date    WBC 5.7 09/14/2016    HCT 45.9 09/14/2016    MCV 93.3 11/11/2011    PLT 241 09/14/2016     Lab Results   Component Value Date    CHOL 196 09/14/2016    TRIG 168 09/14/2016    HDL 63 09/14/2016    LDLCALC 99 09/14/2016     Lab Results   Component Value Date    ALT 48 09/14/2016    AST 31 09/14/2016     BMP:    Lab Results   Component Value Date    NA 140 09/14/2016    K 4.4 09/14/2016    CL 103 09/14/2016    CO2 26 09/14/2016    BUN 14 09/14/2016    CREATININE 0.9 09/14/2016     CMP:   Lab Results   Component Value Date    NA 140 09/14/2016    K 4.4 09/14/2016    CL 103 09/14/2016    CO2 26 09/14/2016    BUN 14 09/14/2016     TSH:  No results found for: TSH, TSHHS      Impression:    No diagnosis found.   Patient Active Problem List   Diagnosis Code   ??? Hyperlipidemia E78.5   ??? Tachycardia  R00.0   ??? V tach (HCC) I47.2   ??? Swelling R60.9   ??? H/O 24 hour EKG monitoring Z92.89   ??? Palpitations R00.2   ??? Frequent PVCs I49.3       Assessment & Plan:    -  Hypertension: Patients blood pressure is normal. Patient is advised about low sodium diet. Present medical regimen will not be changed.           -  LIPID MANAGEMENT:  Available lipid  lab data reviewed  and patient was given dietary advice. NCEP- ATP III guidelines reviewed with patient.    -   Changes  in medicines made: No                       - Arrythmia NO ISSUES    -, HAD lhc AND ep IN [address]2013        Carl Papadopoulos  MA  [facility] IncFACC

## 2017-11-23 ENCOUNTER — Other Ambulatory Visit: Payer: Self-pay | Admitting: Adult Health

## 2017-12-07 ENCOUNTER — Ambulatory Visit: Payer: Self-pay | Admitting: Adult Health

## 2018-01-07 NOTE — Progress Notes (Deleted)
FOLLOW UP  Assessment and Plan:   Hypertension Well controlled with current medications  Monitor blood pressure at home; patient to call if consistently greater than 130/80 Continue DASH diet.   Reminder to go to the ER if any CP, SOB, nausea, dizziness, severe HA, changes vision/speech, left arm numbness and tingling and jaw pain.  Cholesterol Currently at goal; continue statin therapy Continue low cholesterol diet and exercise.  Check lipid panel.   Other abnormal glucose Recent A1Cs at goal Discussed diet/exercise, weight management  Defer A1C; check BMP  Obesity with co morbidities Long discussion about weight loss, diet, and exercise Recommended diet heavy in fruits and veggies and low in animal meats, cheeses, and dairy products, appropriate calorie intake Discussed ideal weight for height and weight goal for next visit (***) Patient will work on *** Will follow up in 3 months  Vitamin D Def At goal at last visit; continue supplementation to maintain goal of 70-100 Defer Vit D level  Hypogonadism - continue to monitor, states medication is helping with symptoms of low T.   Gout Continue allopurinol Diet discussed Check uric acid as needed  GERD Well managed on current medications *** Discussed diet, avoiding triggers and other lifestyle changes   Continue diet and meds as discussed. Further disposition pending results of labs. Discussed med's effects and SE's.   Over 30 minutes of exam, counseling, chart review, and critical decision making was performed.   Future Appointments  Date Time Provider Department Center  01/08/2018  4:15 PM Judd Gaudier, NP [facility] None  09/06/2018  3:00 PM Judd Gaudier, NP [facility] None    ----------------------------------------------------------------------------------------------------------------------  HPI 57 y.o. male  presents for 3 month follow up on hypertension, cholesterol, glucose management, obesity,  GERD, hypogonadism, gout and vitamin D deficiency.   he has a diagnosis of GERD which is currently managed by *** he reports symptoms {ACTION; ARE/ARE ZOX:[id number]} currently well controlled, and denies breakthrough reflux, burning in chest, hoarseness or cough.    BMI is There is no height or weight on file to calculate BMI., he {HAS HAS JWJ:19147} been working on diet and exercise. Wt Readings from Last 3 Encounters:  09/06/17 239 lb (108.4 kg)  06/02/17 240 lb (108.9 kg)  10/25/16 242 lb (109.8 kg)    His blood pressure {HAS HAS NOT:18834} been controlled at home, today their BP is    He {DOES_DOES WGN:56213} workout. He denies chest pain, shortness of breath, dizziness.   He is on cholesterol medication and denies myalgias. His cholesterol is at goal. The cholesterol last visit was:   Lab Results  Component Value Date   CHOL 168 09/06/2017   HDL 54 09/06/2017   LDLCALC 96 09/06/2017   TRIG 88 09/06/2017   CHOLHDL 3.1 09/06/2017    He {Has/has not:18111} been working on diet and exercise for glucose management, and denies {Symptoms; diabetes w/o none:19199}. Last A1C in the office was:  Lab Results  Component Value Date   HGBA1C 5.4 09/06/2017   Patient is on Vitamin D supplement.   Lab Results  Component Value Date   VD25OH 11 09/06/2017     Patient is on allopurinol for gout and does not report a recent flare.  Lab Results  Component Value Date   LABURIC 7.9 06/02/2017   He has a history of testosterone deficiency and is on testosterone replacement. He states that the testosterone helps with his energy, libido, muscle mass. Lab Results  Component Value Date   TESTOSTERONE 364 06/02/2017  Current Medications:  Current Outpatient Medications on File Prior to Visit  Medication Sig  . allopurinol (ZYLOPRIM) 300 MG tablet Take 1 tablet (300 mg total) by mouth daily.  . B-D 3CC LUER-LOK SYR 22GX1" 22G X 1" 3 ML MISC USE AS DIRECTED  . cholecalciferol (VITAMIN D)  1000 units tablet Take 5,000 Units by mouth daily.  . colchicine 0.6 MG tablet Take 1 tablet (0.6 mg total) by mouth daily.  . cyclobenzaprine (FLEXERIL) 5 MG tablet Take 1 tablet (5 mg total) by mouth 3 (three) times daily as needed for muscle spasms.  Marland Kitchen losartan (COZAAR) 50 MG tablet Take 1 tablet (50 mg total) by mouth daily.  . meloxicam (MOBIC) 15 MG tablet Take 1 tablet by mouth as needed for pain, can take with tylenol, can not take with aleve, iburpofen.  . pantoprazole (PROTONIX) 40 MG tablet TAKE 1 TABLET BY MOUTH EVERY DAY 30 MINUTES BEFORE FOOD  . pravastatin (PRAVACHOL) 40 MG tablet TAKE 1 TABLET BY MOUTH EVERY DAY  . predniSONE (DELTASONE) 20 MG tablet 1 tab 3 x day for 2 days, then 1 tab 2 x day for 2 days, then 1 tab 1 x day for 3 days  . ranitidine (ZANTAC) 150 MG tablet Take 1 tablet (150 mg total) by mouth 2 (two) times daily.  . sildenafil (VIAGRA) 100 MG tablet Take 1/4-1/2 tab as needed 1 hour prior  . testosterone cypionate (DEPOTESTOSTERONE CYPIONATE) 200 MG/ML injection INJECT EVERY 2 WEEKS   No current facility-administered medications on file prior to visit.      Allergies: No Known Allergies   Medical History:  Past Medical History:  Diagnosis Date  . Hyperlipidemia   . Hypertension   . Hypogonadism male   . OSA (obstructive sleep apnea)   . Other testicular hypofunction   . Pre-diabetes   . Vitamin D deficiency disease    Family history- Reviewed and unchanged Social history- Reviewed and unchanged   Review of Systems:  Review of Systems  Constitutional: Negative for malaise/fatigue and weight loss.  HENT: Negative for hearing loss and tinnitus.   Eyes: Negative for blurred vision and double vision.  Respiratory: Negative for cough, shortness of breath and wheezing.   Cardiovascular: Negative for chest pain, palpitations, orthopnea, claudication and leg swelling.  Gastrointestinal: Negative for abdominal pain, blood in stool, constipation,  diarrhea, heartburn, melena, nausea and vomiting.  Genitourinary: Negative.   Musculoskeletal: Negative for joint pain and myalgias.  Skin: Negative for rash.  Neurological: Negative for dizziness, tingling, sensory change, weakness and headaches.  Endo/Heme/Allergies: Negative for polydipsia.  Psychiatric/Behavioral: Negative.   All other systems reviewed and are negative.     Physical Exam: There were no vitals taken for this visit. Wt Readings from Last 3 Encounters:  09/06/17 239 lb (108.4 kg)  06/02/17 240 lb (108.9 kg)  10/25/16 242 lb (109.8 kg)   General Appearance: Well nourished, in no apparent distress. Eyes: PERRLA, EOMs, conjunctiva no swelling or erythema Sinuses: No Frontal/maxillary tenderness ENT/Mouth: Ext aud canals clear, TMs without erythema, bulging. No erythema, swelling, or exudate on post pharynx.  Tonsils not swollen or erythematous. Hearing normal.  Neck: Supple, thyroid normal.  Respiratory: Respiratory effort normal, BS equal bilaterally without rales, rhonchi, wheezing or stridor.  Cardio: RRR with no MRGs. Brisk peripheral pulses without edema.  Abdomen: Soft, + BS.  Non tender, no guarding, rebound, hernias, masses. Lymphatics: Non tender without lymphadenopathy.  Musculoskeletal: Full ROM, 5/5 strength, Normal gait Skin: Warm, dry without  rashes, lesions, ecchymosis.  Neuro: Cranial nerves intact. No cerebellar symptoms.  Psych: Awake and oriented X 3, normal affect, Insight and Judgment appropriate.    Dan MakerAshley C Hedi Barkan, NP 12:57 PM [facility]

## 2018-01-08 ENCOUNTER — Ambulatory Visit: Payer: Self-pay | Admitting: Adult Health

## 2018-03-27 ENCOUNTER — Other Ambulatory Visit: Payer: Self-pay | Admitting: Adult Health

## 2018-04-21 ENCOUNTER — Other Ambulatory Visit: Payer: Self-pay | Admitting: Adult Health

## 2018-05-03 ENCOUNTER — Other Ambulatory Visit: Payer: Self-pay | Admitting: Adult Health

## 2018-05-10 ENCOUNTER — Other Ambulatory Visit: Payer: Self-pay

## 2018-05-10 MED ORDER — PANTOPRAZOLE SODIUM 40 MG PO TBEC: DELAYED_RELEASE_TABLET | ORAL | 1 refills | Status: AC

## 2018-08-10 ENCOUNTER — Other Ambulatory Visit: Payer: Self-pay | Admitting: Internal Medicine

## 2018-09-06 ENCOUNTER — Encounter: Payer: Self-pay | Admitting: Adult Health

## 2018-09-20 LAB — CBC
Basophils %: 0.8 %
Basophils Absolute: 0 /uL
Eosinophils %: 3.1 %
Eosinophils Absolute: 0.2 /uL
Hematocrit: 44.5 % (ref 41–53)
Hemoglobin: 15.2 g/dL (ref 13.5–17.5)
Lymphocytes %: 42.9 %
Lymphocytes Absolute: 2.6 /uL
MCH: 31.3 pg
MCHC: 34.1 g/dL
MCV: 91.9 fL
Monocytes %: 7.9 %
Monocytes Absolute: 0.5 /uL
Neutrophils %: 45.3 %
Neutrophils Absolute: 2.8 /uL
Platelets: 232 K/ÂµL
RBC: 4.84 10^6/ÂµL
WBC: 6.1 10^3/mL

## 2018-09-20 LAB — PSA, FREE: PSA: 1.56 ng/mL

## 2018-09-20 LAB — COMPREHENSIVE METABOLIC PANEL
ALT: 30 U/L
AST: 23 U/L
Albumin: 4.9
Alkaline Phosphatase: 63 U/L
BUN: 13 mg/dL
CO2: 21 mmol/L
Calcium: 9.7 mg/dL
Chloride: 103 mmol/L
Creatinine: 1.1
Glucose: 107 mg/dL
Potassium: 4 mmol/L
Sodium: 141 mmol/L
Total Bilirubin: 0.6 mg/dL (ref 0.1–1.4)
Total Protein: 7.1

## 2018-09-20 LAB — LIPID PANEL
Cholesterol, Total: 186 mg/dL
HDL: 56 mg/dL (ref 35–70)
LDL Calculated: 97 mg/dL (ref 0–160)
Triglycerides: 167 mg/dL
VLDL: 33 mg/dL

## 2018-10-15 ENCOUNTER — Ambulatory Visit: Admit: 2018-10-15 | Discharge: 2018-10-15 | Payer: PRIVATE HEALTH INSURANCE | Primary: Family Medicine

## 2018-10-15 ENCOUNTER — Ambulatory Visit
Admit: 2018-10-15 | Discharge: 2018-10-15 | Payer: PRIVATE HEALTH INSURANCE | Attending: Cardiovascular Disease | Primary: Family Medicine

## 2018-10-15 DIAGNOSIS — I10 Essential (primary) hypertension: Secondary | ICD-10-CM

## 2018-10-15 NOTE — Progress Notes (Signed)
GXT Complete.

## 2018-10-15 NOTE — Progress Notes (Signed)
CARDIOLOGY NOTE      10/15/2018    RE: Carl Castro  (Feb 18, 1961)                               TO:  Dr.  Nathen May, MD            CHIEF COMPLAINT   Carl Castro is a 58 y.o. male who was seen today for management of  htn                                    HPI:   Patient is here for    - Hypertension,is  well controlled, pt is  compliant with medicines  - Hyperlipidimea, lipids are in acceptable range. Pt  is  compliant with medicines  - Arrythmias                The patient does not have cardiac complaints    Carl Castro has the following history recorded in care path:  Patient Active Problem List    Diagnosis Date Noted   ??? Palpitations    ??? Frequent PVCs    ??? Swelling 11/15/2011   ??? H/O 24 hour EKG monitoring 11/03/2011   ??? V tach (HCC) 10/06/2011   ??? Hyperlipidemia    ??? Tachycardia      Current Outpatient Medications   Medication Sig Dispense Refill   ??? aspirin 81 MG tablet Take 81 mg by mouth daily.     ??? rosuvastatin (CRESTOR) 5 MG tablet Take 5 mg by mouth daily.       No current facility-administered medications for this visit.      Allergies: Patient has no known allergies.  Past Medical History:   Diagnosis Date   ??? Atypical chest pain    ??? Family history of early CAD    ??? Frequent PVCs    ??? H/O 24 hour EKG monitoring 11/03/11    48 hour- the predominant rhythm is sinus rhythm with one 10-beat run of nonsustained ventricular tachycardia, the patient is already being referred for EP study to Dr. Dianah Castro in [place]   ??? H/O cardiovascular stress test 09/15/2011    09/15/2011-Normal study. No ischemia. LVSF normal. EF 63%.    ??? H/O echocardiogram 09/15/2011    09/15/2011- LVSF normal. EF 55%.Impaired LV relaxation. Mild TR.-report in epic   ??? History of complete ECG 09/08/2011   ??? Hyperlipidemia    ??? Palpitations    ??? S/P cardiac cath 11/11/2011    normal coronaries. reffer for EP study   ??? S/P radiofrequency ablation operation for arrhythmia    ??? Tachycardia      No past surgical history on file.    As reviewed   Family History   Problem Relation Age of Onset   ??? Heart Disease Father         CMP A-fib     Social History     Tobacco Use   ??? Smoking status: Current Some Day Smoker     Types: Cigars   ??? Smokeless tobacco: Former Neurosurgeon   ??? Tobacco comment: occ   Substance Use Topics   ??? Alcohol use: Yes     Alcohol/week: 4.0 standard drinks     Types: 4 Cans of beer per week      Review of Systems:    Constitutional: Negative  for diaphoresis and fatigue  Psychological:Negative for anxiety or depression  HENT: Negative for headaches, nasal congestion, sinus pain or vertigo  Eyes: Negative for visual disturbance.   Endocrine: Negative for polydipsia/polyuria  Respiratory: Negative for shortness of breath  Cardiovascular: Negative for chest pain, dyspnea on exertion, claudication, edema, irregular heartbeat, murmur, palpitations or shortness of breath  Gastrointestinal: Negative for abdominal pain or heartburn  Genito-Urinary: Negative for urinary frequency/urgency  Musculoskeletal: Negative for muscle pain, muscular weakness, negative for pain in arm and leg or swelling in foot and leg  Neurological: Negative for dizziness, headaches, memory loss, numbness/tingling, visual changes, syncope  Dermatological: Negative for rash    Objective:  There were no vitals taken for this visit.  Wt Readings from Last 3 Encounters:   10/10/17 248 lb 3.2 oz (112.6 kg)   10/12/16 247 lb 11.2 oz (112.4 kg)   10/05/15 248 lb (112.5 kg)     There is no height or weight on file to calculate BMI.  GENERAL - Alert, oriented, pleasant, in no apparent distress.  EYES: No jaundice, no conjunctival pallor.  SKIN: It is warm & dry. No rashes. No Echhymosis    HEENT - No clinically significant abnormalities seen.  Neck - Supple.  No jugular venous distention noted. No carotid bruits.   Cardiovascular - Normal S1 and S2 without obvious murmur or gallop.    Extremities - No cyanosis, clubbing, or significant edema.    Pulmonary - No respiratory  distress.  No wheezes or rales.    Abdomen - No masses, tenderness, or organomegaly.  Musculoskeletal - No significant edema. No joint deformities. No muscle wasting.  Neurologic - Cranial nerves II through XII are grossly intact.  There were no gross focal neurologic abnormalities.    Lab Review   No results found for: CKTOTAL, CKMB, CKMBINDEX, TROPONINT  BNP:  No results found for: BNP  PT/INR:    Lab Results   Component Value Date    INR 0.99 11/11/2011     No results found for: LABA1C  Lab Results   Component Value Date    WBC 5.9 07/27/2017    HCT 48.7 07/27/2017    MCV 93.3 11/11/2011    PLT 247 07/27/2017     Lab Results   Component Value Date    CHOL 184 07/27/2017    TRIG 141 07/27/2017    HDL 62 07/27/2017    LDLCALC 94 07/27/2017     Lab Results   Component Value Date    ALT 32 07/27/2017    AST 23 07/27/2017     BMP:    Lab Results   Component Value Date    NA 142 07/27/2017    K 4.5 07/27/2017    CL 102 07/27/2017    CO2 24 07/27/2017    BUN 9 07/27/2017    CREATININE 0.9 07/27/2017     CMP:   Lab Results   Component Value Date    NA 142 07/27/2017    K 4.5 07/27/2017    CL 102 07/27/2017    CO2 24 07/27/2017    BUN 9 07/27/2017     TSH:  No results found for: TSH, TSHHS      Impression:    No diagnosis found.   Patient Active Problem List   Diagnosis Code   ??? Hyperlipidemia E78.5   ??? Tachycardia R00.0   ??? V tach (HCC) I47.2   ??? Swelling R60.9   ??? H/O 24 hour EKG monitoring  Z92.89   ??? Palpitations R00.2   ??? Frequent PVCs I49.3       Assessment & Plan:    -  Hypertension: Patients blood pressure is normal. Patient is advised about low sodium diet. Present medical regimen will not be changed.                           -  LIPID MANAGEMENT:  Available lipid  lab data reviewed  and patient was given dietary advice. NCEP- ATP III guidelines reviewed with patient.    -   Changes  in medicines made: No                                - Arrythymias; ETT  stable      Carl Brooks  MA  [facility]

## 2018-10-15 NOTE — Telephone Encounter (Signed)
Called patient in error. Please disregard

## 2019-02-07 DIAGNOSIS — Z0001 Encounter for general adult medical examination with abnormal findings: Secondary | ICD-10-CM | POA: Diagnosis not present

## 2019-02-07 DIAGNOSIS — E782 Mixed hyperlipidemia: Secondary | ICD-10-CM | POA: Diagnosis not present

## 2019-02-07 DIAGNOSIS — I10 Essential (primary) hypertension: Secondary | ICD-10-CM | POA: Diagnosis not present

## 2019-02-07 DIAGNOSIS — Z7689 Persons encountering health services in other specified circumstances: Secondary | ICD-10-CM | POA: Diagnosis not present

## 2019-02-12 DIAGNOSIS — E559 Vitamin D deficiency, unspecified: Secondary | ICD-10-CM | POA: Diagnosis not present

## 2019-02-12 DIAGNOSIS — E782 Mixed hyperlipidemia: Secondary | ICD-10-CM | POA: Diagnosis not present

## 2019-02-12 DIAGNOSIS — M1A09X0 Idiopathic chronic gout, multiple sites, without tophus (tophi): Secondary | ICD-10-CM | POA: Diagnosis not present

## 2019-02-12 DIAGNOSIS — I10 Essential (primary) hypertension: Secondary | ICD-10-CM | POA: Diagnosis not present

## 2019-02-12 DIAGNOSIS — Z1329 Encounter for screening for other suspected endocrine disorder: Secondary | ICD-10-CM | POA: Diagnosis not present

## 2019-02-12 DIAGNOSIS — Z87891 Personal history of nicotine dependence: Secondary | ICD-10-CM | POA: Diagnosis not present

## 2019-02-12 DIAGNOSIS — E291 Testicular hypofunction: Secondary | ICD-10-CM | POA: Diagnosis not present

## 2019-02-12 DIAGNOSIS — Z125 Encounter for screening for malignant neoplasm of prostate: Secondary | ICD-10-CM | POA: Diagnosis not present

## 2019-10-07 ENCOUNTER — Encounter: Attending: Cardiovascular Disease | Primary: Family Medicine

## 2019-10-11 ENCOUNTER — Ambulatory Visit: Payer: Self-pay

## 2019-10-14 ENCOUNTER — Ambulatory Visit
Admit: 2019-10-14 | Discharge: 2019-10-14 | Payer: BLUE CROSS/BLUE SHIELD | Attending: Nurse Practitioner | Primary: Family Medicine

## 2019-10-14 DIAGNOSIS — R002 Palpitations: Secondary | ICD-10-CM

## 2019-10-19 ENCOUNTER — Ambulatory Visit: Payer: Self-pay

## 2019-10-25 ENCOUNTER — Ambulatory Visit: Payer: Self-pay | Attending: Internal Medicine

## 2019-10-25 DIAGNOSIS — Z23 Encounter for immunization: Secondary | ICD-10-CM

## 2019-10-25 NOTE — Progress Notes (Signed)
   Covid-19 Vaccination Clinic  Name:  [name]    MRN: [id number] DOB: [birth year]  10/25/2019  [name] was observed post Covid-19 immunization for 15 minutes without incident. He was provided with Vaccine Information Sheet and instruction to access the V-Safe system.   [name] was instructed to call 911 with any severe reactions post vaccine: Marland Kitchen Difficulty breathing  . Swelling of face and throat  . A fast heartbeat  . A bad rash all over body  . Dizziness and weakness   Immunizations Administered    Name Date Dose VIS Date Route   Pfizer COVID-19 Vaccine 10/25/2019  5:03 PM 0.3 mL 07/19/2019 Intramuscular   Manufacturer: [employer], [employer]   Lot: [id number]   NDC: [address]-9

## 2019-10-28 ENCOUNTER — Encounter: Attending: Cardiovascular Disease | Primary: Family Medicine

## 2019-11-20 ENCOUNTER — Ambulatory Visit: Payer: Self-pay | Attending: Internal Medicine

## 2019-11-20 DIAGNOSIS — Z23 Encounter for immunization: Secondary | ICD-10-CM

## 2019-11-20 NOTE — Progress Notes (Signed)
   Covid-19 Vaccination Clinic  Name:  [name]    MRN: [id number] DOB: 1960-12-25  11/20/2019  [name] was observed post Covid-19 immunization for 15 minutes without incident. He was provided with Vaccine Information Sheet and instruction to access the V-Safe system.   [name] was instructed to call 911 with any severe reactions post vaccine: Marland Kitchen Difficulty breathing  . Swelling of face and throat  . A fast heartbeat  . A bad rash all over body  . Dizziness and weakness   Immunizations Administered    Name Date Dose VIS Date Route   Pfizer COVID-19 Vaccine 11/20/2019  3:57 PM 0.3 mL 07/19/2019 Intramuscular   Manufacturer: [employer], [employer]   Lot: [id number]   NDC: [address]-8

## 2020-10-05 LAB — COMPREHENSIVE METABOLIC PANEL
ALT: 33 U/L
AST: 21 U/L
Albumin: 4.6
Alkaline Phosphatase: 65 U/L
BUN: 13 mg/dL
CO2: 21 mmol/L
Calcium: 9.5 mg/dL
Chloride: 103 mmol/L
Creatinine: 0.8
Glucose: 105 mg/dL
Potassium: 4.2 mmol/L
Sodium: 139 mmol/L
Total Bilirubin: 0.6 mg/dL (ref 0.1–1.4)
Total Protein: 6.6

## 2020-10-05 LAB — LIPID PANEL
Cholesterol, Total: 198 mg/dL
HDL: 57 mg/dL (ref 35–70)
LDL Calculated: 107 mg/dL (ref 0–160)
Triglycerides: 172 mg/dL
VLDL: 34 mg/dL

## 2020-10-12 ENCOUNTER — Ambulatory Visit
Admit: 2020-10-12 | Discharge: 2020-10-12 | Payer: BLUE CROSS/BLUE SHIELD | Attending: Cardiovascular Disease | Primary: Family Medicine

## 2020-10-12 DIAGNOSIS — E785 Hyperlipidemia, unspecified: Secondary | ICD-10-CM

## 2020-10-12 NOTE — Patient Instructions (Signed)
**  It is YOUR responsibilty to bring medication bottles and/or updated medication list to EACH APPOINTMENT. This will allow us to better serve you and all your healthcare needs**    Please be informed that if you contact our office outside of normal business hours the physician on call cannot help with any scheduling or rescheduling issues, procedure instruction questions or any type of medication issue.    We advise you for any urgent/emergency that you go to the nearest emergency room!    PLEASE CALL OUR OFFICE DURING NORMAL BUSINESS HOURS    Monday - Friday   8 am to 5 pm    [place]: [phone]    [place]: [phone]    [place]:  [phone]

## 2020-10-12 NOTE — Progress Notes (Signed)
CARDIOLOGY NOTE      10/12/2020    RE: Carl Castro  ([birth year])                               TO:  Dr.  Nathen May, MD            CHIEF COMPLAINT   Carl Castro is a 60 y.o. male who was seen today for management of hypertension                                    HPI:                   Pt has h/o hypertension, hyperlipidemia, arrhythmias, seen today for follow-up. Pt has no cardiac complaints    Carl Castro has the following history recorded in care path:  Patient Active Problem List    Diagnosis Date Noted   ??? Palpitations    ??? Frequent PVCs    ??? Swelling 11/15/2011   ??? H/O 24 hour EKG monitoring 11/03/2011   ??? V tach (HCC) 10/06/2011   ??? Hyperlipidemia    ??? Tachycardia      Current Outpatient Medications   Medication Sig Dispense Refill   ??? LATANOPROST OP Apply to eye     ??? aspirin 81 MG tablet Take 81 mg by mouth daily.     ??? rosuvastatin (CRESTOR) 5 MG tablet Take 5 mg by mouth daily.       No current facility-administered medications for this visit.     Allergies: Patient has no known allergies.  Past Medical History:   Diagnosis Date   ??? Atypical chest pain    ??? Family history of early CAD    ??? Frequent PVCs    ??? H/O 24 hour EKG monitoring 11/03/11    48 hour- the predominant rhythm is sinus rhythm with one 10-beat run of nonsustained ventricular tachycardia, the patient is already being referred for EP study to Dr. Dianah Field in [place]   ??? H/O cardiovascular stress test 09/15/2011    09/15/2011-Normal study. No ischemia. LVSF normal. EF 63%.    ??? H/O echocardiogram 09/15/2011    09/15/2011- LVSF normal. EF 55%.Impaired LV relaxation. Mild TR.-report in epic   ??? History of complete ECG 09/08/2011   ??? Hyperlipidemia    ??? Palpitations    ??? S/P cardiac cath 11/11/2011    normal coronaries. reffer for EP study   ??? S/P radiofrequency ablation operation for arrhythmia    ??? Tachycardia      No past surgical history on file.   As reviewed   Family History   Problem Relation Age of Onset   ??? Heart Disease  Father         CMP A-fib     Social History     Tobacco Use   ??? Smoking status: Former Smoker     Types: Cigars     Quit date: 05/2019     Years since quitting: 1.4   ??? Smokeless tobacco: Former Neurosurgeon   ??? Tobacco comment: maybe 6 cigars a yr   Substance Use Topics   ??? Alcohol use: Yes     Alcohol/week: 4.0 standard drinks     Types: 4 Cans of beer per week     Comment: 12 pk beer a wk/ 1cup of coffee a day  Objective:    There were no vitals filed for this visit.  There were no vitals taken for this visit.    No flowsheet data found.     Wt Readings from Last 3 Encounters:   10/14/19 251 lb 3.2 oz (113.9 kg)   10/15/18 248 lb (112.5 kg)   10/10/17 248 lb 3.2 oz (112.6 kg)     There is no height or weight on file to calculate BMI.  GENERAL - Alert, oriented, pleasant, in no apparent distress.  EYES: No jaundice, no conjunctival pallor.  SKIN: It is warm & dry. No rashes. No Echhymosis    HEENT ??? No clinically significant abnormalities seen.  Neck - Supple.  No jugular venous distention noted. No carotid bruits.   Cardiovascular ??? Normal S1 and S2 without obvious murmur or gallop.    Extremities - No cyanosis, clubbing, or significant edema.    Pulmonary ??? No respiratory distress.  No wheezes or rales.    Abdomen ??? No masses, tenderness, or organomegaly.  Musculoskeletal ??? No significant edema. No joint deformities. No muscle wasting.  Neurologic ??? Cranial nerves II through XII are grossly intact.  There were no gross focal neurologic abnormalities.    Lab Review   No results found for: CKTOTAL, CKMB, CKMBINDEX, TROPONINT  BNP:  No results found for: BNP  PT/INR:    Lab Results   Component Value Date    INR 0.99 11/11/2011     No results found for: LABA1C  Lab Results   Component Value Date    WBC 6.1 09/20/2018    HCT 44.5 09/20/2018    MCV 91.9 09/20/2018    PLT 232 09/20/2018     Lab Results   Component Value Date    CHOL 198 10/05/2020    TRIG 172 10/05/2020    HDL 57 10/05/2020    LDLCALC 107 10/05/2020      Lab Results   Component Value Date    ALT 33 10/05/2020    AST 21 10/05/2020     BMP:    Lab Results   Component Value Date    NA 139 10/05/2020    K 4.2 10/05/2020    CL 103 10/05/2020    CO2 21 10/05/2020    BUN 13 10/05/2020    CREATININE 0.8 10/05/2020     CMP:   Lab Results   Component Value Date    NA 139 10/05/2020    K 4.2 10/05/2020    CL 103 10/05/2020    CO2 21 10/05/2020    BUN 13 10/05/2020     TSH:  No results found for: TSH, TSHHS        Assessment & Plan:    -  Hypertension: Patients blood pressure is normal. Patient is advised about low sodium diet. Present medical regimen will not be changed.       Patient's blood pressure is well controlled          -Cath done in the past and has no CAD        -  LIPID MANAGEMENT:  Importance of lipid levels discussed with patient   and patient was given dietary advice. NCEP- ATP III guidelines reviewed with patient.    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          [name]  MD    [facility]    Please note this report has been partially produced using speech recognition software and may contain errors related to that system including errors in grammar, punctuation, and spelling, as well as words and phrases that may be inappropriate. If there are any questions or concerns please feel free to contact the dictating provider for clarification.

## 2021-10-22 LAB — LIPID PANEL
Cholesterol, Total: 182 mg/dL
HDL: 62 mg/dL (ref 35–70)
LDL Calculated: 97 mg/dL (ref 0–160)
Triglycerides: 116 mg/dL
VLDL: 23 mg/dL

## 2021-10-22 LAB — CBC WITH AUTO DIFFERENTIAL
Basophils %: 0.7 %
Basophils Absolute: 0.1 /L
Eosinophils %: 2 %
Eosinophils Absolute: 0.2 /L
Hematocrit: 44.7 % (ref 41–53)
Hemoglobin: 15.6 g/dL (ref 13.5–17.5)
Lymphocytes %: 25.6 %
Lymphocytes Absolute: 2.2 /L
MCH: 31.3 pg
MCHC: 34.9 g/dL
MCV: 89.6 fL
MPV: 10.8 fL
Monocytes %: 6.3 %
Monocytes Absolute: 0.6 /L
Neutrophils %: 65.2 %
Neutrophils Absolute: 5.7 /L
Platelets: 243 K/L
RBC: 4.99 10^6/L
RDW: 13.1 %
WBC: 8.7 10^3/mL

## 2021-10-22 LAB — COMPREHENSIVE METABOLIC PANEL
ALT: 23 U/L
AST: 20 U/L
Albumin: 4.9
Alkaline Phosphatase: 64 U/L
BUN: 12 mg/dL
CO2: 22 mmol/L
Calcium: 10.1 mg/dL
Chloride: 102 mmol/L
Creatinine: 1
Glucose: 96 mg/dL
Potassium: 4.1 mmol/L
Sodium: 138 mmol/L
Total Bilirubin: 0.7 mg/dL (ref 0.1–1.4)
Total Protein: 6.9

## 2021-10-22 LAB — PSA, DIAGNOSTIC: PSA: 2.09 ng/mL

## 2021-10-26 ENCOUNTER — Encounter: Attending: Cardiovascular Disease | Primary: Family Medicine

## 2021-10-26 ENCOUNTER — Ambulatory Visit
Admit: 2021-10-26 | Discharge: 2021-10-26 | Payer: BLUE CROSS/BLUE SHIELD | Attending: Nurse Practitioner | Primary: Family Medicine

## 2021-10-26 DIAGNOSIS — E785 Hyperlipidemia, unspecified: Secondary | ICD-10-CM

## 2021-10-26 NOTE — Patient Instructions (Signed)
Please be informed that if you contact our office outside of normal business hours the physician on call cannot help with any scheduling or rescheduling issues, procedure instruction questions or any type of medication issue.    We advise you for any urgent/emergency that you go to the nearest emergency room!    PLEASE CALL OUR OFFICE DURING NORMAL BUSINESS HOURS    Monday - Friday   8 am to 5 pm    [place]: [phone]    [place]: [phone]    [place]:  [phone]    **It is YOUR responsibilty to bring medication bottles and/or updated medication list to EACH APPOINTMENT. This will allow us to better serve you and all your healthcare needs**    [place] [facility] [place] [facility] Locations - No appointment necessary.  Sites open Monday to Friday. Call your preferred location for test preparation, business   hours and other information you need. [facility] accepts all insurances.  [place]   [address]   [address] [place] [address]  Phone: [phone] [place]  [address] [place] [address]  Phone: [phone]

## 2021-10-26 NOTE — Progress Notes (Signed)
10/26/2021  Primary cardiologist: Dr. Deforest Hoyles    CC:   Carl Castro  is an established 61 y.o.  male here for a 12 month follow up on hypertension      SUBJECTIVE/OBJECTIVE:  Carl Castro is a 61 y.o. male with a history of hypertension, hyperlipidemia and arrhythmia (VT).      HPI :   Ej reports he is feeling well. He denies palpitations. He has minimal caffeine use.  He is keeping his self active reports compliance with medications.  Currently on statin therapy without complaints of myalgia    Review of Systems   Constitutional: Negative for diaphoresis and malaise/fatigue.   Cardiovascular:  Negative for chest pain, claudication, dyspnea on exertion, irregular heartbeat, leg swelling, near-syncope, orthopnea, palpitations and paroxysmal nocturnal dyspnea.   Respiratory:  Negative for shortness of breath.    Neurological:  Negative for dizziness and light-headedness.     Vitals:    10/26/21 1151   BP: 124/76   Site: Left Upper Arm   Position: Sitting   Cuff Size: Medium Adult   Pulse: 68   SpO2: 97%   Weight: 243 lb (110.2 kg)   Height: 6\' 3"  (1.905 m)     No flowsheet data found.  Wt Readings from Last 3 Encounters:   10/26/21 243 lb (110.2 kg)   10/12/20 243 lb 9.6 oz (110.5 kg)   10/14/19 251 lb 3.2 oz (113.9 kg)     Body mass index is 30.37 kg/m??.    Physical Exam  Vitals reviewed.   HENT:      Head: Normocephalic and atraumatic.      Mouth/Throat:      Mouth: Mucous membranes are moist.   Eyes:      Extraocular Movements: Extraocular movements intact.      Pupils: Pupils are equal, round, and reactive to light.   Neck:      Vascular: No carotid bruit.   Cardiovascular:      Rate and Rhythm: Normal rate and regular rhythm.      Pulses: Normal pulses.   Pulmonary:      Effort: Pulmonary effort is normal.      Breath sounds: Normal breath sounds.   Abdominal:      General: There is no distension.      Palpations: Abdomen is soft.      Tenderness: There is no abdominal tenderness.   Musculoskeletal:         General:  Normal range of motion.      Cervical back: No tenderness.      Right lower leg: No edema.      Left lower leg: No edema.   Skin:     General: Skin is warm and dry.      Capillary Refill: Capillary refill takes less than 2 seconds.   Neurological:      General: No focal deficit present.      Mental Status: He is alert and oriented to person, place, and time.   Psychiatric:         Mood and Affect: Mood normal.         Behavior: Behavior normal.              Current Outpatient Medications   Medication Sig Dispense Refill    latanoprost (XALATAN) 0.005 % ophthalmic solution Instill 1 drop in affected eye(s) daily at bedtime 40 DAY SUPPLY      aspirin 81 MG tablet Take 81 mg by mouth daily.  rosuvastatin (CRESTOR) 5 MG tablet Take 5 mg by mouth daily.       No current facility-administered medications for this visit.          All pertinent data reviewed and discussed with patient       ASSESSMENT/PLAN:      Dyslipidemia   Labs were noted from October 21, 2021:   Total cholesterol 182, triglycerides 116, HDL 62, LDL 97.  Lipids are at goal - continue with crestor - denies myalgia   encouraged healthy eating habits including low fat -low /cholesterol diet    Arrhythmia  H/o of VT in past   Denies palpitations.  EKG sinus rhythm with PACs and PVC  He is asymptomatic.  Continue with ongoing surveillance      Tests ordered:  none   Follow-up  1 year      Signed:  Quenten Raven, APRN - CNP, 10/26/2021, 12:09 PM    An electronic signature was used to authenticate this note.    Please note this report has been partially produced using speech recognition software and may contain errors related to that system including errors in grammar, punctuation, and spelling, as well as words and phrases that may be inappropriate. If there are any questions or concerns please feel free to contact the dictating provider for clarification.

## 2022-05-14 ENCOUNTER — Emergency Department (HOSPITAL_BASED_OUTPATIENT_CLINIC_OR_DEPARTMENT_OTHER)
Admission: EM | Admit: 2022-05-14 | Discharge: 2022-05-14 | Disposition: A | Discharge status: Home / Self Care | Payer: 59 | Attending: Emergency Medicine | Admitting: Emergency Medicine

## 2022-05-14 ENCOUNTER — Emergency Department (HOSPITAL_BASED_OUTPATIENT_CLINIC_OR_DEPARTMENT_OTHER): Payer: 59 | Admitting: Radiology

## 2022-05-14 ENCOUNTER — Encounter (HOSPITAL_BASED_OUTPATIENT_CLINIC_OR_DEPARTMENT_OTHER): Payer: Self-pay

## 2022-05-14 ENCOUNTER — Other Ambulatory Visit: Payer: Self-pay

## 2022-05-14 DIAGNOSIS — R079 Chest pain, unspecified: Secondary | ICD-10-CM | POA: Insufficient documentation

## 2022-05-14 DIAGNOSIS — I10 Essential (primary) hypertension: Secondary | ICD-10-CM | POA: Diagnosis not present

## 2022-05-14 LAB — CBC
HCT: 46.3 % (ref 39.0–52.0)
Hemoglobin: 15.6 g/dL (ref 13.0–17.0)
MCH: 31.2 pg (ref 26.0–34.0)
MCHC: 33.7 g/dL (ref 30.0–36.0)
MCV: 92.6 fL (ref 80.0–100.0)
Platelets: 259 10*3/uL (ref 150–400)
RBC: 5 MIL/uL (ref 4.22–5.81)
RDW: 12.5 % (ref 11.5–15.5)
WBC: 8.4 10*3/uL (ref 4.0–10.5)
nRBC: 0 % (ref 0.0–0.2)

## 2022-05-14 LAB — BASIC METABOLIC PANEL
Anion gap: 10 (ref 5–15)
BUN: 18 mg/dL (ref 8–23)
CO2: 27 mmol/L (ref 22–32)
Calcium: 9.9 mg/dL (ref 8.9–10.3)
Chloride: 103 mmol/L (ref 98–111)
Creatinine, Ser: 1.15 mg/dL (ref 0.61–1.24)
GFR, Estimated: >60 mL/min (ref >60)
Glucose, Bld: 82 mg/dL (ref 70–99)
Potassium: 4.7 mmol/L (ref 3.5–5.1)
Sodium: 140 mmol/L (ref 135–145)

## 2022-05-14 LAB — TROPONIN I (HIGH SENSITIVITY)
Troponin I (High Sensitivity): 4 ng/L (ref <18)
Troponin I (High Sensitivity): 3 ng/L (ref <18)

## 2022-05-14 NOTE — ED Provider Notes (Signed)
[facility] EMERGENCY DEPT Provider Note   CSN: [id number] Arrival date & time: 05/14/22  1111     History  Chief Complaint  Patient presents with   Chest Pain   HPI [name] is a 61 y.o. male with hypertension and hyperlipidemia presenting for chest pain.  Chest pain started a week ago.  Located in the upper left portion of his chest extending into the shoulder.  Denies shortness of breath, nausea, and diaphoresis.  Did state that the pain can radiate to his back at times.  Pain comes and goes.  Feels tight and dull.  Non-reproducible. Denies trauma.    Chest Pain      Home Medications Prior to Admission medications   Medication Sig Start Date End Date Taking? Authorizing Provider  allopurinol (ZYLOPRIM) 300 MG tablet Take 1 tablet (300 mg total) by mouth daily. 06/02/17 06/02/18  Judd Gaudier, NP  B-D 3CC LUER-LOK SYR 22GX1" 22G X 1" 3 ML MISC USE AS DIRECTED 07/15/13   Loree Fee, PA-C  cholecalciferol (VITAMIN D) 1000 units tablet Take 5,000 Units by mouth daily.    [provider]  colchicine 0.6 MG tablet Take 1 tablet (0.6 mg total) by mouth daily. 06/02/17 06/02/18  Judd Gaudier, NP  cyclobenzaprine (FLEXERIL) 5 MG tablet Take 1 tablet (5 mg total) by mouth 3 (three) times daily as needed for muscle spasms. 09/06/17   Judd Gaudier, NP  losartan (COZAAR) 50 MG tablet Take 1 tablet (50 mg total) by mouth daily. 09/11/17   Judd Gaudier, NP  meloxicam (MOBIC) 15 MG tablet Take 1 tablet by mouth as needed for pain, can take with tylenol, can not take with aleve, iburpofen. 09/28/17   Judd Gaudier, NP  pantoprazole (PROTONIX) 40 MG tablet TAKE 1 TABLET BY MOUTH EVERY DAY 30 MINUTES BEFORE FOOD 05/10/18   Judd Gaudier, NP  pravastatin (PRAVACHOL) 40 MG tablet Take 1 tablet daily to last til keeps office visit 04/22/18   Lucky Cowboy, MD  predniSONE (DELTASONE) 20 MG tablet 1 tab 3 x day for 2 days, then 1 tab 2 x day for 2 days, then  1 tab 1 x day for 3 days 06/28/17   Lucky Cowboy, MD  ranitidine (ZANTAC) 150 MG tablet Take 1 tablet (150 mg total) by mouth 2 (two) times daily. 09/06/17 09/06/18  Judd Gaudier, NP  sildenafil (VIAGRA) 100 MG tablet Take 1/4-1/2 tab as needed 1 hour prior 06/02/17   Judd Gaudier, NP  testosterone cypionate (DEPOTESTOSTERONE CYPIONATE) 200 MG/ML injection INJECT EVERY 2 WEEKS 08/11/17   Lucky Cowboy, MD      Allergies    Patient has no known allergies.    Review of Systems   Review of Systems  Cardiovascular:  Positive for chest pain.    Physical Exam Updated Vital Signs BP 134/85   Pulse (!) 55   Temp 98 F (36.7 C) (Oral)   Resp 17   Ht 5\' 11"  (1.803 m)   Wt 113.4 kg   SpO2 100%   BMI 34.87 kg/m  Physical Exam Vitals and nursing note reviewed.  HENT:     Head: Normocephalic and atraumatic.     Mouth/Throat:     Mouth: Mucous membranes are moist.  Eyes:     General:        Right eye: No discharge.        Left eye: No discharge.     Conjunctiva/sclera: Conjunctivae normal.  Cardiovascular:     Rate and  Rhythm: Normal rate and regular rhythm.     Pulses: Normal pulses.     Heart sounds: Normal heart sounds.  Pulmonary:     Effort: Pulmonary effort is normal.     Breath sounds: Normal breath sounds.  Abdominal:     General: Abdomen is flat.     Palpations: Abdomen is soft.  Skin:    General: Skin is warm and dry.  Neurological:     General: No focal deficit present.  Psychiatric:        Mood and Affect: Mood normal.     ED Results / Procedures / Treatments   Labs (all labs ordered are listed, but only abnormal results are displayed) Labs Reviewed  BASIC METABOLIC PANEL  CBC  TROPONIN I (HIGH SENSITIVITY)  TROPONIN I (HIGH SENSITIVITY)    EKG EKG Interpretation  Date/Time:  Saturday May 14 2022 12:05:04 EDT Ventricular Rate:  70 PR Interval:  136 QRS Duration: 70 QT Interval:  372 QTC Calculation: 401 R Axis:   17 Text  Interpretation: Normal sinus rhythm Normal ECG When compared with ECG of 23-Dec-2007 08:30, Criteria for Septal infarct are no longer Present No significant change since last tracing Confirmed by Jacalyn Lefevre [phone]) on 05/14/2022 4:01:00 PM  Radiology DG Chest 2 View  Result Date: 05/14/2022 CLINICAL DATA:  Chest pain EXAM: CHEST - 2 VIEW COMPARISON:  01/23/2014 chest radiograph FINDINGS: The cardiomediastinal silhouette is unremarkable. There is no evidence of focal airspace disease, pulmonary edema, suspicious pulmonary nodule/mass, pleural effusion, or pneumothorax. No acute bony abnormalities are identified. IMPRESSION: No active cardiopulmonary disease. Electronically Signed   By: Harmon Pier M.D.   On: 05/14/2022 12:56    Procedures Procedures    Medications Ordered in ED Medications - No data to display  ED Course/ Medical Decision Making/ A&P                           Medical Decision Making Amount and/or Complexity of Data Reviewed Labs: ordered. Radiology: ordered.   This patient presents to the ED for concern of chest pain, this involves a number of treatment options, and is a complaint that carries with it a high risk of complications and morbidity.  The differential diagnosis includes ACS, PE, aortic dissection, and MSK iinjury.   Co morbidities: Discussed in HPI   EMR reviewed including pt PMHx, past surgical history and past visits to ER.   See HPI for more details   Lab Tests:   I personally reviewed all laboratory work and imaging. Metabolic panel without any acute abnormality specifically kidney function within normal limits and no significant electrolyte abnormalities. CBC without leukocytosis or significant anemia.   Imaging Studies:  NAD. I personally reviewed all imaging studies and no acute abnormality found. I agree with radiology interpretation.    Cardiac Monitoring:  The patient was maintained on a cardiac monitor.  I personally viewed  and interpreted the cardiac monitored which showed an underlying rhythm of: NSR EKG non-ischemic   Medicines ordered:  No medications ordered.  Reevaluation of the patient after these medicines showed that the patient improved I have reviewed the patients home medicines and have made adjustments as needed   Consults/Attending Physician   I discussed this case with my attending physician who cosigned this note including patient's presenting symptoms, physical exam, and planned diagnostics and interventions. Attending physician stated agreement with plan or made changes to plan which were implemented.   Reevaluation:  After the interventions noted above I re-evaluated patient and found that they have :improved   Problem List / ED Course: Patient presented for chest pain.  Considered aortic dissection but unlikely given adequately controlled blood pressure and symmetric pulses.  Considered PE but unlikely given no shortness of breath and not tachycardic.  Also consider ACS but unlikely given normal EKG and normal troponins.  Upon reevaluation of this patient patient stated that his chest pain is much improved without intervention.  Dispostion:  After consideration of the diagnostic results and the patients response to treatment, I feel that the patient would benefit from discharge and follow-up with PCP regarding intermittent atypical chest pain.        Final Clinical Impression(s) / ED Diagnoses Final diagnoses:  Chest pain, unspecified type    Rx / DC Orders ED Discharge Orders     None         Harriet Pho, PA-C 05/14/22 1832    Isla Pence, MD 05/14/22 2220

## 2022-05-14 NOTE — ED Triage Notes (Signed)
Pt arrives POV from home with c/o "on and off" chest pain.  He days he gets a tight chest pain that comes and goes.  He is not currently having any chest pain.  Denies shortness of breath, nausea/vomiting, diaphoresis/  He says the pain can come on while he is working or at rest and typically resolves in 1-3 minutes.  Pt also reports he has left arm numbness for approximately on month, he has numbness to the thumb and pointer finger.  He says he sees Dr. Casimer Leek with [facility] for left shoulder.  Pt ambulatory to triage, in NAD.

## 2022-05-14 NOTE — Discharge Instructions (Addendum)
Evaluation for chest pain was overall reassuring.  EKG was reassuring along with cardiac enzymes being within normal limits.  Recommend that you follow-up with your PCP regarding intermittent chest pain.  Likely source of your chest pain is musculoskeletal given its location and worse with movement.  If you have worsening chest pain, new shortness of breath, new calf tenderness, chest pain radiating to the back please return to the emergency department for further evaluation.

## 2022-09-29 ENCOUNTER — Encounter (HOSPITAL_COMMUNITY): Payer: Self-pay | Admitting: Emergency Medicine

## 2022-09-29 ENCOUNTER — Emergency Department (HOSPITAL_COMMUNITY)
Admission: EM | Admit: 2022-09-29 | Discharge: 2022-09-29 | Disposition: A | Discharge status: Home / Self Care | Payer: 59 | Attending: Emergency Medicine | Admitting: Emergency Medicine

## 2022-09-29 ENCOUNTER — Other Ambulatory Visit: Payer: Self-pay

## 2022-09-29 DIAGNOSIS — M25512 Pain in left shoulder: Secondary | ICD-10-CM | POA: Insufficient documentation

## 2022-09-29 DIAGNOSIS — Y92410 Unspecified street and highway as the place of occurrence of the external cause: Secondary | ICD-10-CM | POA: Diagnosis not present

## 2022-09-29 DIAGNOSIS — Z79899 Other long term (current) drug therapy: Secondary | ICD-10-CM | POA: Diagnosis not present

## 2022-09-29 DIAGNOSIS — I10 Essential (primary) hypertension: Secondary | ICD-10-CM | POA: Diagnosis not present

## 2022-09-29 DIAGNOSIS — M549 Dorsalgia, unspecified: Secondary | ICD-10-CM | POA: Insufficient documentation

## 2022-09-29 MED ORDER — CYCLOBENZAPRINE HCL 10 MG PO TABS: 10.0000 mg | ORAL_TABLET | Freq: Two times a day (BID) | ORAL | 0 refills | Status: AC | PRN

## 2022-09-29 MED ORDER — IBUPROFEN 800 MG PO TABS
800.0000 mg | ORAL_TABLET | Freq: Once | ORAL | Status: AC
  Administered 2022-09-29: 800 mg via ORAL
  Filled 2022-09-29: qty 1

## 2022-09-29 MED ORDER — IBUPROFEN 600 MG PO TABS: 600.0000 mg | ORAL_TABLET | Freq: Four times a day (QID) | ORAL | 0 refills | Status: AC | PRN
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
## 2022-10-06 NOTE — Telephone Encounter (Signed)
Cardiologist: Dr. Paticia Stack  Surgeon: Dr. Grandville Silos  Surgery: Right knee unicompartmental arthroplasty- Poss Right total knee arthroplasty  Anesthesia: Spinal  Date: 10/27/2022  FAX# [id number]  Ph# [phone]    Last OV 10/26/2021 w/Missy      Dyslipidemia   Labs were noted from October 21, 2021:   Total cholesterol 182, triglycerides 116, HDL 62, LDL 97.  Lipids are at goal - continue with crestor - denies myalgia   encouraged healthy eating habits including low fat -low /cholesterol diet     Arrhythmia  H/o of VT in past   Denies palpitations.  EKG sinus rhythm with PACs and PVC  He is asymptomatic.  Continue with ongoing surveillance        Last EKG- 10/26/2021      Stress Test- 10/15/2018  Pt has baseline EKG changes   Normal exercise performance without angina and ischemic EKG changes.      Echo- 2013      Aspirin

## 2022-10-17 ENCOUNTER — Encounter
Admit: 2022-10-17 | Discharge: 2022-10-17 | Payer: BLUE CROSS/BLUE SHIELD | Attending: Cardiovascular Disease | Primary: Family Medicine

## 2022-10-17 DIAGNOSIS — I493 Ventricular premature depolarization: Secondary | ICD-10-CM

## 2022-10-17 NOTE — Progress Notes (Signed)
CARDIOLOGY NOTE      10/17/2022    RE: SEM [name]  (08-Feb-1961)                               TO:  Dr.  Patrina Levering, MD            CHIEF COMPLAINT   Carl Castro is a 62 y.o. male who was seen today for management of hypertension                                    HPI:                   Pt has h/o hypertension, hyperlipidemia, arrhythmias, seen today for follow-up. Pt has no cardiac complaints    Carl Castro has the following history recorded in care path:  Patient Active Problem List    Diagnosis Date Noted    Palpitations     Frequent PVCs     Swelling 11/15/2011    H/O 24 hour EKG monitoring 11/03/2011    V tach ([place]) 10/06/2011    Hyperlipidemia     Tachycardia      Current Outpatient Medications   Medication Sig Dispense Refill    latanoprost (XALATAN) 0.005 % ophthalmic solution Instill 1 drop in affected eye(s) daily at bedtime 40 DAY SUPPLY      aspirin 81 MG tablet Take 81 mg by mouth daily.      rosuvastatin (CRESTOR) 5 MG tablet Take 5 mg by mouth daily.       No current facility-administered medications for this visit.     Allergies: Patient has no known allergies.  Past Medical History:   Diagnosis Date    Atypical chest pain     Family history of early CAD     Frequent PVCs     H/O 24 hour EKG monitoring 11/03/11    48 hour- the predominant rhythm is sinus rhythm with one 10-beat run of nonsustained ventricular tachycardia, the patient is already being referred for EP study to Dr. Roberts Gaudy in [place]    H/O cardiovascular stress test 09/15/2011    09/15/2011-Normal study. No ischemia. LVSF normal. EF 63%.     H/O echocardiogram 09/15/2011    09/15/2011- LVSF normal. EF 55%.Impaired LV relaxation. Mild TR.-report in epic    History of complete ECG 09/08/2011    Hyperlipidemia     Palpitations     S/P cardiac cath 11/11/2011    normal coronaries. reffer for EP study    S/P radiofrequency ablation operation for arrhythmia     Tachycardia      No past surgical history on file.   As reviewed    Family History   Problem Relation Age of Onset    Heart Disease Father         CMP A-fib     Social History     Tobacco Use    Smoking status: Former     Types: Cigars     Quit date: 05/2019     Years since quitting: 3.4    Smokeless tobacco: Former    Tobacco comments:     maybe 6 cigars a yr   Substance Use Topics    Alcohol use: Yes     Alcohol/week: 4.0 standard drinks of alcohol     Types: 4 Cans of beer  per week     Comment: 12 pk beer a wk/ 1cup of coffee a day        Objective:    Vitals:    10/17/22 1406   BP: 122/72   Pulse: 75   Weight: 110.7 kg (244 lb)   Height: 1.93 m ('6\' 4"'$ )     BP 122/72   Pulse 75   Ht 1.93 m ('6\' 4"'$ )   Wt 110.7 kg (244 lb)   BMI 29.70 kg/m          No data to display                 Wt Readings from Last 3 Encounters:   10/17/22 110.7 kg (244 lb)   10/26/21 110.2 kg (243 lb)   10/12/20 110.5 kg (243 lb 9.6 oz)     Body mass index is 29.7 kg/m.  GENERAL - Alert, oriented, pleasant, in no apparent distress.  EYES: No jaundice, no conjunctival pallor.  SKIN: It is warm & dry. No rashes. No Echhymosis    HEENT - No clinically significant abnormalities seen.  Neck - Supple.  No jugular venous distention noted. No carotid bruits.   Cardiovascular - Normal S1 and S2 without obvious murmur or gallop.    Extremities - No cyanosis, clubbing, or significant edema.    Pulmonary - No respiratory distress.  No wheezes or rales.    Abdomen - No masses, tenderness, or organomegaly.  Musculoskeletal - No significant edema. No joint deformities. No muscle wasting.  Neurologic - Cranial nerves II through XII are grossly intact.  There were no gross focal neurologic abnormalities.    Lab Review   No results found for: "CKTOTAL", "CKMB", "CKMBINDEX", "TROPONINT"  BNP:  No results found for: "BNP"  PT/INR:    Lab Results   Component Value Date    INR 0.99 11/11/2011     No results found for: "LABA1C"  Lab Results   Component Value Date    WBC 8.7 10/21/2021    HGB 15.6 10/21/2021    HCT 44.7  10/21/2021    MCV 89.6 10/21/2021    PLT 243 10/21/2021     Lab Results   Component Value Date    CHOL 182 10/21/2021    TRIG 116 10/21/2021    HDL 62 10/21/2021    LDLCALC 97 10/21/2021     Lab Results   Component Value Date    ALT 23 10/21/2021    AST 20 10/21/2021     BMP:    Lab Results   Component Value Date/Time    NA 138 10/21/2021 12:00 AM    K 4.1 10/21/2021 12:00 AM    CL 102 10/21/2021 12:00 AM    CO2 22 10/21/2021 12:00 AM    BUN 12 10/21/2021 12:00 AM    CREATININE 1.0 10/21/2021 12:00 AM     CMP:   Lab Results   Component Value Date/Time    NA 138 10/21/2021 12:00 AM    K 4.1 10/21/2021 12:00 AM    CL 102 10/21/2021 12:00 AM    CO2 22 10/21/2021 12:00 AM    BUN 12 10/21/2021 12:00 AM     TSH:  No results found for: "TSH", "TSHHS"        Assessment & Plan:    -  Hypertension: Patients blood pressure is normal. Patient is advised about low sodium diet. Present medical regimen will not be changed.       Patient's  blood pressure is well controlled          -Cath done in the past and has no CAD        -  LIPID MANAGEMENT:  Importance of lipid levels discussed with patient   and patient was given dietary advice. NCEP- ATP III guidelines reviewed with patient.    -   Changes  in medicines made: No     On Crestor patient is compliant with medications and diet             LDL 107              -Arrhythmias; has history of VT in the past had extensive work-up done which was unremarkable-     Mortality from the morbid obesity is very high:     Body mass index is 29.7 kg/m.                     Edrick Kins  MD    [facility]    Please note this report has been partially produced using speech recognition software and may contain errors related to that system including errors in grammar, punctuation, and spelling, as well as words and phrases that may be inappropriate. If there are any questions or concerns please feel free to contact the dictating provider for clarification.

## 2022-10-18 LAB — COMPREHENSIVE METABOLIC PANEL
ALT: 26 U/L
AST: 20 U/L
Albumin: 4.5
Alkaline Phosphatase: 57 U/L
BUN: 12 mg/dL
CO2: 24 mmol/L
Calcium: 9.6 mg/dL
Chloride: 104 mmol/L
Creatinine: 0.8
Est, Glomerular Filtration Rate: 100
Glucose: 109 mg/dL
Potassium: 4.4 mmol/L
Total Bilirubin: 0.6 mg/dL (ref 0.1–1.4)
Total Protein: 7

## 2022-10-18 LAB — LIPID PANEL
Cholesterol, Total: 168 mg/dL
HDL: 62 mg/dL (ref 35–70)
LDL Calculated: 76 mg/dL (ref 0–160)
Triglycerides: 149 mg/dL
VLDL: 30 mg/dL

## 2022-10-18 LAB — PSA SCREENING: PSA: 2.93 ng/mL

## 2022-11-28 ENCOUNTER — Ambulatory Visit
Admit: 2022-11-28 | Discharge: 2022-11-28 | Payer: PRIVATE HEALTH INSURANCE | Attending: Cardiovascular Disease | Primary: Family Medicine

## 2022-11-28 DIAGNOSIS — I493 Ventricular premature depolarization: Secondary | ICD-10-CM

## 2022-11-28 NOTE — Patient Instructions (Signed)
We are committed to providing you the best care possible.    If you receive a survey after visiting one of our offices, please take time to share your experience concerning your physician office visit.  These surveys are confidential and no health information about you is shared.    We are eager to improve for you and we are counting on your feedback to help make that happen.      Thank you for allowing us to care for you today!   We want to ensure we can follow your treatment plan and we strive to give you the best outcomes and experience possible.   If you ever have a life threatening emergency and call 911 - for an ambulance (EMS)   Our providers can only care for you at:   [facility] or [facility].   Even if you have someone take you or you drive yourself we can only care for you in a  facility. Our providers are not setup at the other healthcare locations!   **It is YOUR responsibilty to bring medication bottles and/or updated medication list to EACH APPOINTMENT. This will allow us to better serve you and all your healthcare needs**

## 2022-11-28 NOTE — Progress Notes (Signed)
CARDIOLOGY NOTE      11/28/2022    RE: Carl Castro  (Jul 30, 1961)                               TO:  Dr.  Nathen May, MD            CHIEF COMPLAINT   Carl Castro is a 62 y.o. male who was seen today for management of hypertension                                    HPI:                   Pt has h/o hypertension, hyperlipidemia, arrhythmias, seen today for follow-up. Pt has no cardiac complaints    Carl Castro has the following history recorded in care path:  Patient Active Problem List    Diagnosis Date Noted    Palpitations     Frequent PVCs     Swelling 11/15/2011    H/O 24 hour EKG monitoring 11/03/2011    V tach ([facility]) 10/06/2011    Hyperlipidemia     Tachycardia      Current Outpatient Medications   Medication Sig Dispense Refill    latanoprost (XALATAN) 0.005 % ophthalmic solution Instill 1 drop in affected eye(s) daily at bedtime 40 DAY SUPPLY      aspirin 81 MG tablet Take 81 mg by mouth daily.      rosuvastatin (CRESTOR) 5 MG tablet Take 5 mg by mouth daily.       No current facility-administered medications for this visit.     Allergies: Patient has no known allergies.  Past Medical History:   Diagnosis Date    Atypical chest pain     Family history of early CAD     Frequent PVCs     H/O 24 hour EKG monitoring 11/03/11    48 hour- the predominant rhythm is sinus rhythm with one 10-beat run of nonsustained ventricular tachycardia, the patient is already being referred for EP study to Dr. Dianah Field in [place]    H/O cardiovascular stress test 09/15/2011    09/15/2011-Normal study. No ischemia. LVSF normal. EF 63%.     H/O echocardiogram 09/15/2011    09/15/2011- LVSF normal. EF 55%.Impaired LV relaxation. Mild TR.-report in epic    History of complete ECG 09/08/2011    Hyperlipidemia     Palpitations     S/P cardiac cath 11/11/2011    normal coronaries. reffer for EP study    S/P radiofrequency ablation operation for arrhythmia     Tachycardia      No past surgical history on file.   As reviewed    Family History   Problem Relation Age of Onset    Heart Disease Father         CMP A-fib     Social History     Tobacco Use    Smoking status: Former     Types: Cigars     Quit date: 05/2019     Years since quitting: 3.5    Smokeless tobacco: Former    Tobacco comments:     maybe 6 cigars a yr   Substance Use Topics    Alcohol use: Yes     Alcohol/week: 4.0 standard drinks of alcohol     Types: 4 Cans of beer  per week     Comment: 12 pk beer a wk/ 1cup of coffee a day        Objective:    There were no vitals filed for this visit.    There were no vitals taken for this visit.         No data to display                   Wt Readings from Last 3 Encounters:   10/17/22 110.7 kg (244 lb)   10/26/21 110.2 kg (243 lb)   10/12/20 110.5 kg (243 lb 9.6 oz)     There is no height or weight on file to calculate BMI.  GENERAL - Alert, oriented, pleasant, in no apparent distress.  EYES: No jaundice, no conjunctival pallor.  SKIN: It is warm & dry. No rashes. No Echhymosis    HEENT - No clinically significant abnormalities seen.  Neck - Supple.  No jugular venous distention noted. No carotid bruits.   Cardiovascular - Normal S1 and S2 without obvious murmur or gallop.    Extremities - No cyanosis, clubbing, or significant edema.    Pulmonary - No respiratory distress.  No wheezes or rales.    Abdomen - No masses, tenderness, or organomegaly.  Musculoskeletal - No significant edema. No joint deformities. No muscle wasting.  Neurologic - Cranial nerves II through XII are grossly intact.  There were no gross focal neurologic abnormalities.    Lab Review   No results found for: "CKTOTAL", "CKMB", "CKMBINDEX", "TROPONINT"  BNP:  No results found for: "BNP"  PT/INR:    Lab Results   Component Value Date    INR 0.99 11/11/2011     No results found for: "LABA1C"  Lab Results   Component Value Date    WBC 8.7 10/21/2021    HGB 15.6 10/21/2021    HCT 44.7 10/21/2021    MCV 89.6 10/21/2021    PLT 243 10/21/2021     Lab Results   Component  Value Date    CHOL 182 10/21/2021    TRIG 116 10/21/2021    HDL 62 10/21/2021    LDLCALC 97 10/21/2021     Lab Results   Component Value Date    ALT 23 10/21/2021    AST 20 10/21/2021     BMP:    Lab Results   Component Value Date/Time    NA 138 10/21/2021 12:00 AM    K 4.1 10/21/2021 12:00 AM    CL 102 10/21/2021 12:00 AM    CO2 22 10/21/2021 12:00 AM    BUN 12 10/21/2021 12:00 AM    CREATININE 1.0 10/21/2021 12:00 AM     CMP:   Lab Results   Component Value Date/Time    NA 138 10/21/2021 12:00 AM    K 4.1 10/21/2021 12:00 AM    CL 102 10/21/2021 12:00 AM    CO2 22 10/21/2021 12:00 AM    BUN 12 10/21/2021 12:00 AM     TSH:  No results found for: "TSH", "TSHHS"        Assessment & Plan:    - post Rt knee surgery stable recent   No cardiac issues    -  Hypertension: Patients blood pressure is normal. Patient is advised about low sodium diet. Present medical regimen will not be changed.       Patient's blood pressure is well controlled          -Cath done in  the past and has no CAD        -  LIPID MANAGEMENT:  Importance of lipid levels discussed with patient   and patient was given dietary advice. NCEP- ATP III guidelines reviewed with patient.    -   Changes  in medicines made: No     On Crestor patient is compliant with medications and diet             LDL 107              -Arrhythmias; has history of VT in the past had extensive work-up done which was unremarkable-     Mortality from the morbid obesity is very high:     There is no height or weight on file to calculate BMI.                     Carl Brooks  MD    [facility]    Please note this report has been partially produced using speech recognition software and may contain errors related to that system including errors in grammar, punctuation, and spelling, as well as words and phrases that may be inappropriate. If there are any questions or concerns please feel free to contact the dictating provider for clarification.

## 2023-05-22 ENCOUNTER — Ambulatory Visit
Admit: 2023-05-22 | Discharge: 2023-05-22 | Payer: PRIVATE HEALTH INSURANCE | Attending: Nurse Practitioner | Primary: Family Medicine

## 2023-05-22 DIAGNOSIS — E785 Hyperlipidemia, unspecified: Secondary | ICD-10-CM

## 2023-05-22 NOTE — Progress Notes (Signed)
05/22/2023  Primary cardiologist: Dr. Deforest Hoyles    CC:   Carl Castro  is an established 62 y.o.  male here for a follow up on HLD       SUBJECTIVE/OBJECTIVE:  HPI  Carl Castro is a 62 y.o. male with a history of HTN    Carl Castro reports he is feeling well. Had knee surgery earlier this year. Starting to get back to exercise.     Review of Systems   Constitutional: Negative for diaphoresis and malaise/fatigue.   Cardiovascular:  Negative for chest pain, claudication, dyspnea on exertion, irregular heartbeat, leg swelling, near-syncope, orthopnea, palpitations and paroxysmal nocturnal dyspnea.   Respiratory:  Negative for shortness of breath.    Neurological:  Negative for dizziness and light-headedness.       Vitals:    05/22/23 1554   BP: 118/74   Site: Left Upper Arm   Position: Sitting   Cuff Size: Large Adult   Pulse: 68   Weight: 111.6 kg (246 lb)   Height: 1.905 m (6\' 3" )     Wt Readings from Last 3 Encounters:   05/22/23 111.6 kg (246 lb)   11/28/22 110.2 kg (243 lb)   10/17/22 110.7 kg (244 lb)      Body mass index is 30.75 kg/m.     Physical Exam  Vitals reviewed.   Eyes:      Pupils: Pupils are equal, round, and reactive to light.   Cardiovascular:      Rate and Rhythm: Normal rate and regular rhythm.      Pulses: Normal pulses.   Pulmonary:      Effort: Pulmonary effort is normal.      Breath sounds: Normal breath sounds.   Skin:     General: Skin is warm and dry.      Capillary Refill: Capillary refill takes less than 2 seconds.   Neurological:      Mental Status: He is alert and oriented to person, place, and time.                Current Outpatient Medications   Medication Sig Dispense Refill    Multiple Vitamins-Minerals (THERAPEUTIC MULTIVITAMIN-MINERALS) tablet Take 1 tablet by mouth daily      latanoprost (XALATAN) 0.005 % ophthalmic solution Instill 1 drop in affected eye(s) daily at bedtime 40 DAY SUPPLY      aspirin 81 MG tablet Take 1 tablet by mouth daily      rosuvastatin (CRESTOR) 5 MG tablet Take 1  tablet by mouth daily       No current facility-administered medications for this visit.          All pertinent data reviewed and discussed with patient       ASSESSMENT/PLAN:    HLD  Lipids reviewed from 11/2022-at goal with LDL of 76  Continue Crestor       Tests ordered:  none   Follow-up  1 year     Signed:  Quenten Raven, APRN - CNP, 05/22/2023, 4:14 PM    An electronic signature was used to authenticate this note.    Please note this report has been partially produced using speech recognition software and may contain errors related to that system including errors in grammar, punctuation, and spelling, as well as words and phrases that may be inappropriate. If there are any questions or concerns please feel free to contact the dictating provider for clarification.

## 2023-11-13 ENCOUNTER — Other Ambulatory Visit: Payer: Self-pay

## 2023-11-13 ENCOUNTER — Encounter (HOSPITAL_BASED_OUTPATIENT_CLINIC_OR_DEPARTMENT_OTHER): Payer: Self-pay | Admitting: Emergency Medicine

## 2023-11-13 ENCOUNTER — Emergency Department (HOSPITAL_BASED_OUTPATIENT_CLINIC_OR_DEPARTMENT_OTHER)
Admission: EM | Admit: 2023-11-13 | Discharge: 2023-11-13 | Disposition: A | Discharge status: Home / Self Care | Attending: Emergency Medicine | Admitting: Emergency Medicine

## 2023-11-13 DIAGNOSIS — L03116 Cellulitis of left lower limb: Secondary | ICD-10-CM | POA: Insufficient documentation

## 2023-11-13 DIAGNOSIS — M25562 Pain in left knee: Secondary | ICD-10-CM | POA: Diagnosis present

## 2023-11-13 DIAGNOSIS — N182 Chronic kidney disease, stage 2 (mild): Secondary | ICD-10-CM | POA: Diagnosis not present

## 2023-11-13 DIAGNOSIS — I129 Hypertensive chronic kidney disease with stage 1 through stage 4 chronic kidney disease, or unspecified chronic kidney disease: Secondary | ICD-10-CM | POA: Insufficient documentation

## 2023-11-13 DIAGNOSIS — Z87891 Personal history of nicotine dependence: Secondary | ICD-10-CM | POA: Insufficient documentation

## 2023-11-13 DIAGNOSIS — Z79899 Other long term (current) drug therapy: Secondary | ICD-10-CM | POA: Diagnosis not present

## 2023-11-13 MED ORDER — CEPHALEXIN 500 MG PO CAPS: 500.0000 mg | ORAL_CAPSULE | Freq: Four times a day (QID) | ORAL | 0 refills | Status: AC

## 2023-11-13 MED ORDER — KETOROLAC TROMETHAMINE 15 MG/ML IJ SOLN
15.0000 mg | Freq: Once | INTRAMUSCULAR | Status: AC
  Administered 2023-11-13: 15 mg via INTRAMUSCULAR
  Filled 2023-11-13: qty 1

## 2023-11-13 NOTE — Discharge Instructions (Addendum)
 We evaluated you for your knee pain.  We believe your symptoms are due to a skin infection (cellulitis).  We have prescribed you an antibiotic.  Please follow-up with your primary care doctor.  Please take Tylenol and Motrin for your symptoms at home.  You can take 1000 mg of Tylenol every 6 hours and 600 mg of ibuprofen every 6 hours as needed for your symptoms.  You can take these medicines together as needed, either at the same time, or alternating every 3 hours.   Please return if you develop any new or worsening symptoms such as severe worsening pain, trouble bending your knee, fevers, spreading size of the area of redness, or any other new or worsening symptoms.

## 2023-11-13 NOTE — ED Triage Notes (Signed)
 Left knee pain and swelling since Friday. Patient noticed red area appeared after placing ice on knee. Able to walk without limp.

## 2023-11-13 NOTE — ED Provider Notes (Cosign Needed)
 [place] [facility] AT [facility] Provider Note  CSN: [id number] Arrival date & time: 11/13/23 4098  Chief Complaint(s) Knee Pain  HPI [name] is a 63 y.o. male with history of hypertension, hyperlipidemia presenting to the emergency department with left knee pain.  He reports that he noticed some pain in his knee over the past few days.  Also noticed a red area on the knee.  No injury.  Has been able to walk.  Has not taken anything.  No other new symptoms.  No fevers or chills.   Past Medical History Past Medical History:  Diagnosis Date   Hyperlipidemia    Hypertension    Hypogonadism male    OSA (obstructive sleep apnea)    Other testicular hypofunction    Pre-diabetes    Vitamin D deficiency disease    Patient Active Problem List   Diagnosis Date Noted   GERD (gastroesophageal reflux disease) 09/06/2017   CKD (chronic kidney disease), symptom management only, stage 2 (mild) 09/05/2017   Medication management 02/16/2015   Noncompliance 02/16/2015   Obesity 08/05/2014   ED (erectile dysfunction) 08/05/2014   Gout of left ankle 08/05/2014   History of elevated glucose    Vitamin D deficiency disease    Hypogonadism male    OSA (obstructive sleep apnea)    Essential hypertension 06/17/2013   Mixed hyperlipidemia 06/17/2013   Home Medication(s) Prior to Admission medications   Medication Sig Start Date End Date Taking? Authorizing Provider  cephALEXin (KEFLEX) 500 MG capsule Take 1 capsule (500 mg total) by mouth 4 (four) times daily. 11/13/23  Yes Lonell Grandchild, MD  allopurinol (ZYLOPRIM) 300 MG tablet Take 1 tablet (300 mg total) by mouth daily. 06/02/17 06/02/18  Judd Gaudier, NP  B-D 3CC LUER-LOK SYR 22GX1" 22G X 1" 3 ML MISC USE AS DIRECTED 07/15/13   Loree Fee, PA-C  cholecalciferol (VITAMIN D) 1000 units tablet Take 5,000 Units by mouth daily.    [provider]  colchicine 0.6 MG tablet Take 1 tablet (0.6 mg total)  by mouth daily. 06/02/17 06/02/18  Judd Gaudier, NP  cyclobenzaprine (FLEXERIL) 10 MG tablet Take 1 tablet (10 mg total) by mouth 2 (two) times daily as needed for muscle spasms. 09/29/22   Fayrene Helper, PA-C  ibuprofen (ADVIL) 600 MG tablet Take 1 tablet (600 mg total) by mouth every 6 (six) hours as needed. 09/29/22   Fayrene Helper, PA-C  losartan (COZAAR) 50 MG tablet Take 1 tablet (50 mg total) by mouth daily. 09/11/17   Judd Gaudier, NP  meloxicam (MOBIC) 15 MG tablet Take 1 tablet by mouth as needed for pain, can take with tylenol, can not take with aleve, iburpofen. 09/28/17   Judd Gaudier, NP  pantoprazole (PROTONIX) 40 MG tablet TAKE 1 TABLET BY MOUTH EVERY DAY 30 MINUTES BEFORE FOOD 05/10/18   Judd Gaudier, NP  pravastatin (PRAVACHOL) 40 MG tablet Take 1 tablet daily to last til keeps office visit 04/22/18   Lucky Cowboy, MD  predniSONE (DELTASONE) 20 MG tablet 1 tab 3 x day for 2 days, then 1 tab 2 x day for 2 days, then 1 tab 1 x day for 3 days 06/28/17   Lucky Cowboy, MD  ranitidine (ZANTAC) 150 MG tablet Take 1 tablet (150 mg total) by mouth 2 (two) times daily. 09/06/17 09/06/18  Judd Gaudier, NP  sildenafil (VIAGRA) 100 MG tablet Take 1/4-1/2 tab as needed 1 hour prior 06/02/17   Judd Gaudier, NP  testosterone cypionate (  DEPOTESTOSTERONE CYPIONATE) 200 MG/ML injection INJECT EVERY 2 WEEKS 08/11/17   Lucky Cowboy, MD                                                                                                                                    Past Surgical History Past Surgical History:  Procedure Laterality Date   ORIF PATELLA Left 1998   RIGID ESOPHAGOSCOPY N/A 04/10/2016   Procedure: RIGID ESOPHAGOSCOPY;  Surgeon: Christia Reading, MD;  Location: [facility] OR;  Service: ENT;  Laterality: N/A;   Family History Family History  Problem Relation Age of Onset   Diabetes Neg Hx    Heart disease Neg Hx    Hyperlipidemia Neg Hx    Hypertension Neg Hx    Stroke Neg Hx     Cancer Neg Hx     Social History Social History   Tobacco Use   Smoking status: Former    Current packs/day: 0.00    Average packs/day: 1 pack/day for 36.3 years (36.3 ttl pk-yrs)    Types: Cigarettes    Start date: 22    Quit date: 11/25/2010    Years since quitting: 12.9   Smokeless tobacco: Never  Vaping Use   Vaping status: Never Used  Substance Use Topics   Alcohol use: Yes    Comment: occ   Drug use: Yes    Frequency: 3.0 times per week    Types: Marijuana   Allergies Patient has no known allergies.  Review of Systems Review of Systems  All other systems reviewed and are negative.   Physical Exam Vital Signs  I have reviewed the triage vital signs BP (!) 161/99 (BP Location: Right Arm)   Pulse (!) 58   Temp 98.4 F (36.9 C) (Oral)   Resp 16   Ht 5\' 11"  (1.803 m)   Wt 113.4 kg   SpO2 99%   BMI 34.87 kg/m  Physical Exam Vitals and nursing note reviewed.  Constitutional:      General: He is not in acute distress.    Appearance: Normal appearance.  HENT:     Head: Normocephalic and atraumatic.     Mouth/Throat:     Mouth: Mucous membranes are moist.  Eyes:     Conjunctiva/sclera: Conjunctivae normal.  Cardiovascular:     Rate and Rhythm: Normal rate.  Pulmonary:     Effort: Pulmonary effort is normal. No respiratory distress.  Abdominal:     General: Abdomen is flat.  Musculoskeletal:     Comments: Full active painless range of motion of the left knee, patellar tendon intact.  Very mild swelling to the knee, primarily to the medial aspect, on the medial aspect of the knee, there is an approximately 4 x 5 cm area of erythema, warmth, tenderness.  No tenderness to remainder of knee.  Skin:    General: Skin is warm and dry.     Capillary Refill: Capillary refill  takes less than 2 seconds.  Neurological:     General: No focal deficit present.     Mental Status: He is alert. Mental status is at baseline.  Psychiatric:        Mood and Affect:  Mood normal.        Behavior: Behavior normal.     ED Results and Treatments Labs (all labs ordered are listed, but only abnormal results are displayed) Labs Reviewed - No data to display                                                                                                                        Radiology No results found.  Pertinent labs & imaging results that were available during my care of the patient were reviewed by me and considered in my medical decision making (see MDM for details).  Medications Ordered in ED Medications  ketorolac (TORADOL) 15 MG/ML injection 15 mg (has no administration in time range)                                                                                                                                     Procedures Procedures  (including critical care time)  Medical Decision Making / ED Course   MDM:  63 year old presenting to the emergency department with knee pain.  On exam, patient has a focal area of redness and erythema concerning for cellulitis.  Will treat with antibiotics.  No wounds.  Considered other process such as septic joint, but patient able to actively range his knee, ambulate, very low concern for this.  He also denies any systemic symptoms.  Doubt need for any x-ray, no trauma or falls, no tenderness other than the area of redness.  Doubt crystal arthropathy given painless range of motion.  Will discharge patient to home. All questions answered. Patient comfortable with plan of discharge. Return precautions discussed with patient and specified on the after visit summary.        Medicines ordered and prescription drug management: Meds ordered this encounter  Medications   cephALEXin (KEFLEX) 500 MG capsule    Sig: Take 1 capsule (500 mg total) by mouth 4 (four) times daily.    Dispense:  28 capsule    Refill:  0   ketorolac (TORADOL) 15 MG/ML injection 15 mg    -I have reviewed the patients home  medicines and have made adjustments as  needed   Social Determinants of Health:  Diagnosis or treatment significantly limited by social determinants of health: obesity   Reevaluation: After the interventions noted above, I reevaluated the patient and found that their symptoms have improved  Co morbidities that complicate the patient evaluation  Past Medical History:  Diagnosis Date   Hyperlipidemia    Hypertension    Hypogonadism male    OSA (obstructive sleep apnea)    Other testicular hypofunction    Pre-diabetes    Vitamin D deficiency disease       Dispostion: Disposition decision including need for hospitalization was considered, and patient discharged from emergency department.    Final Clinical Impression(s) / ED Diagnoses Final diagnoses:  Cellulitis of left lower extremity     This chart was dictated using voice recognition software.  Despite best efforts to proofread,  errors can occur which can change the documentation meaning.    Lonell Grandchild, MD 11/13/23 [phone]

## 2024-05-20 ENCOUNTER — Ambulatory Visit
Admit: 2024-05-20 | Discharge: 2024-05-20 | Payer: PRIVATE HEALTH INSURANCE | Attending: Nurse Practitioner | Primary: Family Medicine

## 2024-05-20 ENCOUNTER — Encounter: Payer: PRIVATE HEALTH INSURANCE | Attending: Cardiovascular Disease | Primary: Family Medicine

## 2024-05-20 VITALS — BP 120/78 | HR 72 | Ht 75.0 in | Wt 240.0 lb

## 2024-05-20 DIAGNOSIS — E785 Hyperlipidemia, unspecified: Principal | ICD-10-CM

## 2024-05-20 NOTE — Progress Notes (Signed)
 "  05/20/2024  Primary cardiologist: Dr. Martie    CC:   Carl Castro  is an established 63 y.o.  male here for a follow up on HLD      SUBJECTIVE/OBJECTIVE:  Carl Castro is a 63 y.o. male with a history of hyperlipidemia and PVCs/VT.  Carl Castro has a remote history of LHC that showed no significant CAD.  He was seen by EP Dr. Franky Lather  for EP study in the remote past as well.     HPI:  Carl Castro states he is feeling well.  He has had no chest pain, shortness of breath or palpitations since his last visit.  States he has started walking in the evening for exercise.  He is working on weight loss through diet as well.     Review of Systems   Constitutional: Negative for diaphoresis and malaise/fatigue.   Cardiovascular:  Negative for chest pain, claudication, dyspnea on exertion, irregular heartbeat, leg swelling, near-syncope, orthopnea, palpitations and paroxysmal nocturnal dyspnea.   Respiratory:  Negative for shortness of breath.    Neurological:  Negative for dizziness and light-headedness.       Vitals:    05/20/24 1005   BP: 120/78   BP Site: Left Upper Arm   Patient Position: Sitting   BP Cuff Size: Medium Adult   Pulse: 72   Weight: 108.9 kg (240 lb)   Height: 1.905 m (6' 3)     Wt Readings from Last 3 Encounters:   05/20/24 108.9 kg (240 lb)   05/22/23 111.6 kg (246 lb)   11/28/22 110.2 kg (243 lb)      Body mass index is 30 kg/m.     Physical Exam  Vitals reviewed.   Eyes:      Pupils: Pupils are equal, round, and reactive to light.   Neck:      Vascular: No carotid bruit.   Cardiovascular:      Rate and Rhythm: Normal rate and regular rhythm.      Pulses: Normal pulses.   Pulmonary:      Effort: Pulmonary effort is normal.      Breath sounds: Normal breath sounds.   Musculoskeletal:      Cervical back: No tenderness.      Right lower leg: No edema.      Left lower leg: No edema.   Skin:     General: Skin is warm and dry.      Capillary Refill: Capillary refill takes less than 2 seconds.   Neurological:      Mental  Status: He is alert and oriented to person, place, and time.                Current Outpatient Medications   Medication Sig Dispense Refill    Multiple Vitamins-Minerals (THERAPEUTIC MULTIVITAMIN-MINERALS) tablet Take 1 tablet by mouth daily      latanoprost (XALATAN) 0.005 % ophthalmic solution Instill 1 drop in affected eye(s) daily at bedtime 40 DAY SUPPLY      aspirin 81 MG tablet Take 1 tablet by mouth daily      rosuvastatin (CRESTOR) 5 MG tablet Take 1 tablet by mouth daily       No current facility-administered medications for this visit.          All pertinent data reviewed and discussed with patient       ASSESSMENT/PLAN:    HLD  Lipids 10/2022- HDL 62  LDL 76-at goal  Continue crestor    PVC/ VT  Denies symptoms  Remote LHC - normal  Remote ep study- normal   ETT 2020- normal exercise performance without angina or ischemic EKG changes        Tests ordered:  none  Follow-up  6 mo     Signed:  Eleanor Seabrook, APRN - CNP, 05/20/2024, 10:14 AM    An electronic signature was used to authenticate this note.    Please note this report has been partially produced using speech recognition software and may contain errors related to that system including errors in grammar, punctuation, and spelling, as well as words and phrases that may be inappropriate. If there are any questions or concerns please feel free to contact the dictating provider for clarification.   "
